# Patient Record
Sex: Female | Born: 1990 | Race: White | Hispanic: No | Smoking: Current every day smoker
Health system: Southern US, Community
[De-identification: ages and names within clinical notes are randomized; demographics above are authoritative.]

## PROBLEM LIST (undated history)

## (undated) DIAGNOSIS — S2239XA Fracture of one rib, unspecified side, initial encounter for closed fracture: Secondary | ICD-10-CM

---
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
## 2015-10-13 ENCOUNTER — Encounter: Payer: Self-pay | Admitting: Emergency Medicine

## 2015-10-13 ENCOUNTER — Emergency Department: Payer: Self-pay

## 2015-10-13 ENCOUNTER — Emergency Department
Admission: EM | Admit: 2015-10-13 | Discharge: 2015-10-13 | Disposition: A | Discharge status: Home / Self Care | Payer: Self-pay | Attending: Emergency Medicine | Admitting: Emergency Medicine

## 2015-10-13 DIAGNOSIS — Y9389 Activity, other specified: Secondary | ICD-10-CM | POA: Insufficient documentation

## 2015-10-13 DIAGNOSIS — F17200 Nicotine dependence, unspecified, uncomplicated: Secondary | ICD-10-CM | POA: Insufficient documentation

## 2015-10-13 DIAGNOSIS — S93401A Sprain of unspecified ligament of right ankle, initial encounter: Secondary | ICD-10-CM | POA: Insufficient documentation

## 2015-10-13 DIAGNOSIS — Y999 Unspecified external cause status: Secondary | ICD-10-CM | POA: Insufficient documentation

## 2015-10-13 DIAGNOSIS — Y929 Unspecified place or not applicable: Secondary | ICD-10-CM | POA: Insufficient documentation

## 2015-10-13 DIAGNOSIS — W010XXA Fall on same level from slipping, tripping and stumbling without subsequent striking against object, initial encounter: Secondary | ICD-10-CM | POA: Insufficient documentation

## 2015-10-13 NOTE — ED Triage Notes (Signed)
Pt presents to Ed c/o ankle injury that started yesterday and slipped and fell while playing with her dog. Pt states it has been swollen ever since. Pt applied ace bandage and elevated it, "I can't walk on it at all".

## 2015-10-13 NOTE — ED Provider Notes (Signed)
[facility] Emergency Department Provider Note  ____________________________________________  Time seen: Approximately 2:01 PM  I have reviewed the triage vital signs and the nursing notes.   HISTORY  Chief Complaint Ankle Injury    HPI Bonnie Morgan is a 25 y.o. female , NAD, presents to the emergency department with one-day history of right ankle pain and swelling. Patient states she was playing with her dog yesterday when she slipped and fell. States she turned the right ankle and had immediate pain and swelling. States she wrapped it with an Ace bandage and elevated it but noted today that she cannot bear weight. Denies any numbness, weakness, tingling of the right lower extremity. Denies any open wounds or lacerations. Denies any chest pain, shortness breath or palpitations.   History reviewed. No pertinent past medical history.  There are no active problems to display for this patient.   Past Surgical History:  Procedure Laterality Date  . cesarean  2012    Prior to Admission medications   Not on File    Allergies Review of patient's allergies indicates no known allergies.  History reviewed. No pertinent family history.  Social History Social History  Substance Use Topics  . Smoking status: Current Every Day Smoker  . Smokeless tobacco: Current User  . Alcohol use No     Review of Systems  Constitutional: No fatigue Cardiovascular: No chest pain, palpitations. Respiratory:  No shortness of breath. Musculoskeletal: Positive right ankle pain. Negative for neck or back pain. Skin: Positive swelling and bruising right ankle. Negative for rash, redness, open wounds or lacerations. Neurological: Negative for headaches, focal weakness or numbness. No tingling. 10-point ROS otherwise negative.  ____________________________________________   PHYSICAL EXAM:  VITAL SIGNS: ED Triage Vitals  Enc Vitals Group     BP 10/13/15 1304 97/65      Pulse Rate 10/13/15 1304 92     Resp --      Temp 10/13/15 1304 98.2 F (36.8 C)     Temp Source 10/13/15 1304 Oral     SpO2 10/13/15 1304 96 %     Weight --      Height --      Head Circumference --      Peak Flow --      Pain Score 10/13/15 1313 7     Pain Loc --      Pain Edu? --      Excl. in GC? --      Constitutional: Alert and oriented. Well appearing and in no acute distress. Eyes: Conjunctivae are normal.   Head: Atraumatic. Cardiovascular: Good peripheral circulation with 2+ pulses noted in the right lower extremity. Capillary refill is brisk in all digits of the right foot. Respiratory: Normal respiratory effort without tachypnea or retractions.  Musculoskeletal: Pain with full flexion and inversion of the right ankle. No laxity with anterior or posterior drawer. No laxity with varus or valgus stress. Full range of motion of the right toes without pain. No lower extremity tenderness nor edema.  No joint effusions. Neurologic:  Normal speech and language. No gross focal neurologic deficits are appreciated. Station a light touch grossly intact about the right lower extremity Skin:  Moderate swelling noted to the lateral right ankle with mild blue ecchymosis. No open wounds or lacerations. Skin is warm, dry and intact. No rash noted. Psychiatric: Mood and affect are normal. Speech and behavior are normal. Patient exhibits appropriate insight and judgement.   ____________________________________________   LABS  None ____________________________________________  EKG  None ____________________________________________  RADIOLOGY I have personally viewed and evaluated these images (plain radiographs) as part of my medical decision making, as well as reviewing the written report by the radiologist.  Dg Ankle Complete Right  Result Date: 10/13/2015 CLINICAL DATA:  Fall.  Ankle swelling. EXAM: RIGHT ANKLE - COMPLETE 3+ VIEW COMPARISON:  None. FINDINGS: Mild lateral  soft tissue swelling. No underlying bony abnormality. No fracture, subluxation or dislocation. IMPRESSION: No bony abnormality. Electronically Signed   By: Charlett Nose M.D.   On: 10/13/2015 13:37   ____________________________________________    PROCEDURES  Procedure(s) performed: None   Procedures   Medications - No data to display   ____________________________________________   INITIAL IMPRESSION / ASSESSMENT AND PLAN / ED COURSE  Pertinent labs & imaging results that were available during my care of the patient were reviewed by me and considered in my medical decision making (see chart for details).  Clinical Course    Patient's diagnosis is consistent with right ankle sprain. Patient's ankle was rewrapped in the Ace wrap in which she provided and was given crutches to assist with ambulation. Patient asked to apply ice to the affected area 20 minutes 3-4 times daily keep elevated when not ambulating. Patient will be discharged home with instructions to take over-the-counter ibuprofen or Tylenol as needed for pain. Patient is to follow up with Dr. Ernest Pine in orthopedics in 1 week if symptoms persist past this treatment course. Patient is given ED precautions to return to the ED for any worsening or new symptoms.      ____________________________________________  FINAL CLINICAL IMPRESSION(S) / ED DIAGNOSES  Final diagnoses:  Right ankle sprain, initial encounter      NEW MEDICATIONS STARTED DURING THIS VISIT:  There are no discharge medications for this patient.        Hope Pigeon, PA-C 10/13/15 1704    Minna Antis, MD 10/14/15 [phone]

## 2015-10-13 NOTE — ED Notes (Signed)
Pt informed to return if any life threatening symptoms occur.  

## 2015-10-13 NOTE — ED Notes (Signed)
States she fell while walking the dog last pm  Swelling noted at ankle  positive pulses noted

## 2015-12-25 ENCOUNTER — Emergency Department: Payer: Self-pay

## 2015-12-25 ENCOUNTER — Emergency Department
Admission: EM | Admit: 2015-12-25 | Discharge: 2015-12-25 | Disposition: A | Discharge status: Home / Self Care | Payer: Self-pay | Attending: Student in an Organized Health Care Education/Training Program | Admitting: Student in an Organized Health Care Education/Training Program

## 2015-12-25 ENCOUNTER — Encounter: Payer: Self-pay | Admitting: Medical Oncology

## 2015-12-25 DIAGNOSIS — Y999 Unspecified external cause status: Secondary | ICD-10-CM | POA: Insufficient documentation

## 2015-12-25 DIAGNOSIS — Y92194 Driveway of other specified residential institution as the place of occurrence of the external cause: Secondary | ICD-10-CM | POA: Insufficient documentation

## 2015-12-25 DIAGNOSIS — S0083XA Contusion of other part of head, initial encounter: Secondary | ICD-10-CM | POA: Insufficient documentation

## 2015-12-25 DIAGNOSIS — Y9389 Activity, other specified: Secondary | ICD-10-CM | POA: Insufficient documentation

## 2015-12-25 DIAGNOSIS — S060X9A Concussion with loss of consciousness of unspecified duration, initial encounter: Secondary | ICD-10-CM | POA: Insufficient documentation

## 2015-12-25 DIAGNOSIS — T07XXXA Unspecified multiple injuries, initial encounter: Secondary | ICD-10-CM

## 2015-12-25 DIAGNOSIS — F17200 Nicotine dependence, unspecified, uncomplicated: Secondary | ICD-10-CM | POA: Insufficient documentation

## 2015-12-25 DIAGNOSIS — S0990XA Unspecified injury of head, initial encounter: Secondary | ICD-10-CM

## 2015-12-25 LAB — CBC WITH DIFFERENTIAL/PLATELET
Basophils Absolute: 0 10*3/uL (ref 0–0.1)
Basophils Relative: 0 %
EOS ABS: 0 10*3/uL (ref 0–0.7)
EOS PCT: 0 %
HCT: 44.3 % (ref 35.0–47.0)
HEMOGLOBIN: 15.4 g/dL (ref 12.0–16.0)
LYMPHS ABS: 2.2 10*3/uL (ref 1.0–3.6)
Lymphocytes Relative: 22 %
MCH: 33.7 pg (ref 26.0–34.0)
MCHC: 34.8 g/dL (ref 32.0–36.0)
MCV: 96.9 fL (ref 80.0–100.0)
MONO ABS: 0.5 10*3/uL (ref 0.2–0.9)
MONOS PCT: 5 %
Neutro Abs: 7.4 10*3/uL — ABNORMAL HIGH (ref 1.4–6.5)
Neutrophils Relative %: 73 %
PLATELETS: 192 10*3/uL (ref 150–440)
RBC: 4.57 MIL/uL (ref 3.80–5.20)
RDW: 14.1 % (ref 11.5–14.5)
WBC: 10.3 10*3/uL (ref 3.6–11.0)

## 2015-12-25 LAB — BASIC METABOLIC PANEL
Anion gap: 9 (ref 5–15)
BUN: 7 mg/dL (ref 6–20)
CHLORIDE: 108 mmol/L (ref 101–111)
CO2: 23 mmol/L (ref 22–32)
CREATININE: 0.7 mg/dL (ref 0.44–1.00)
Calcium: 8.5 mg/dL — ABNORMAL LOW (ref 8.9–10.3)
GFR calc Af Amer: >60 mL/min (ref >60)
GFR calc non Af Amer: >60 mL/min (ref >60)
Glucose, Bld: 102 mg/dL — ABNORMAL HIGH (ref 65–99)
Potassium: 3.8 mmol/L (ref 3.5–5.1)
SODIUM: 140 mmol/L (ref 135–145)

## 2015-12-25 LAB — URINALYSIS COMPLETE WITH MICROSCOPIC (ARMC ONLY)
BILIRUBIN URINE: NEGATIVE
Bacteria, UA: NONE SEEN
Glucose, UA: NEGATIVE mg/dL
KETONES UR: NEGATIVE mg/dL
NITRITE: NEGATIVE
PH: 6 (ref 5.0–8.0)
Protein, ur: 30 mg/dL — AB
SPECIFIC GRAVITY, URINE: 1.023 (ref 1.005–1.030)

## 2015-12-25 LAB — POCT PREGNANCY, URINE: Preg Test, Ur: NEGATIVE
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
## 2016-03-02 ENCOUNTER — Emergency Department
Admission: EM | Admit: 2016-03-02 | Discharge: 2016-03-02 | Payer: No Typology Code available for payment source | Attending: Emergency Medicine | Admitting: Emergency Medicine

## 2016-03-02 ENCOUNTER — Emergency Department: Payer: No Typology Code available for payment source

## 2016-03-02 ENCOUNTER — Encounter: Payer: Self-pay | Admitting: *Deleted

## 2016-03-02 DIAGNOSIS — Y9389 Activity, other specified: Secondary | ICD-10-CM | POA: Insufficient documentation

## 2016-03-02 DIAGNOSIS — F17200 Nicotine dependence, unspecified, uncomplicated: Secondary | ICD-10-CM | POA: Insufficient documentation

## 2016-03-02 DIAGNOSIS — R930 Abnormal findings on diagnostic imaging of skull and head, not elsewhere classified: Secondary | ICD-10-CM | POA: Insufficient documentation

## 2016-03-02 DIAGNOSIS — S2242XA Multiple fractures of ribs, left side, initial encounter for closed fracture: Secondary | ICD-10-CM | POA: Diagnosis not present

## 2016-03-02 DIAGNOSIS — Y92410 Unspecified street and highway as the place of occurrence of the external cause: Secondary | ICD-10-CM | POA: Insufficient documentation

## 2016-03-02 DIAGNOSIS — Z23 Encounter for immunization: Secondary | ICD-10-CM | POA: Diagnosis not present

## 2016-03-02 DIAGNOSIS — Y999 Unspecified external cause status: Secondary | ICD-10-CM | POA: Insufficient documentation

## 2016-03-02 DIAGNOSIS — R1012 Left upper quadrant pain: Secondary | ICD-10-CM | POA: Insufficient documentation

## 2016-03-02 DIAGNOSIS — S299XXA Unspecified injury of thorax, initial encounter: Secondary | ICD-10-CM | POA: Diagnosis present

## 2016-03-02 DIAGNOSIS — S272XXA Traumatic hemopneumothorax, initial encounter: Secondary | ICD-10-CM

## 2016-03-02 DIAGNOSIS — S225XXA Flail chest, initial encounter for closed fracture: Secondary | ICD-10-CM

## 2016-03-02 DIAGNOSIS — T797XXA Traumatic subcutaneous emphysema, initial encounter: Secondary | ICD-10-CM

## 2016-03-02 LAB — CBC WITH DIFFERENTIAL/PLATELET
Basophils Absolute: 0 10*3/uL (ref 0–0.1)
Basophils Relative: 0 %
EOS ABS: 0 10*3/uL (ref 0–0.7)
EOS PCT: 0 %
HCT: 39.2 % (ref 35.0–47.0)
HEMOGLOBIN: 13.5 g/dL (ref 12.0–16.0)
LYMPHS ABS: 3.1 10*3/uL (ref 1.0–3.6)
Lymphocytes Relative: 16 %
MCH: 33.9 pg (ref 26.0–34.0)
MCHC: 34.5 g/dL (ref 32.0–36.0)
MCV: 98.2 fL (ref 80.0–100.0)
MONOS PCT: 3 %
Monocytes Absolute: 0.6 10*3/uL (ref 0.2–0.9)
NEUTROS PCT: 81 %
Neutro Abs: 15 10*3/uL — ABNORMAL HIGH (ref 1.4–6.5)
Platelets: 218 10*3/uL (ref 150–440)
RBC: 3.99 MIL/uL (ref 3.80–5.20)
RDW: 13.8 % (ref 11.5–14.5)
WBC: 18.8 10*3/uL — ABNORMAL HIGH (ref 3.6–11.0)

## 2016-03-02 LAB — COMPREHENSIVE METABOLIC PANEL
ALK PHOS: 80 U/L (ref 38–126)
ALT: 47 U/L (ref 14–54)
ANION GAP: 12 (ref 5–15)
AST: 190 U/L — ABNORMAL HIGH (ref 15–41)
Albumin: 4.4 g/dL (ref 3.5–5.0)
BUN: <5 mg/dL — ABNORMAL LOW (ref 6–20)
CALCIUM: 9.4 mg/dL (ref 8.9–10.3)
CO2: 25 mmol/L (ref 22–32)
Chloride: 100 mmol/L — ABNORMAL LOW (ref 101–111)
Creatinine, Ser: 0.71 mg/dL (ref 0.44–1.00)
Glucose, Bld: 116 mg/dL — ABNORMAL HIGH (ref 65–99)
Potassium: 2.9 mmol/L — ABNORMAL LOW (ref 3.5–5.1)
SODIUM: 137 mmol/L (ref 135–145)
TOTAL PROTEIN: 7.8 g/dL (ref 6.5–8.1)
Total Bilirubin: 0.8 mg/dL (ref 0.3–1.2)

## 2016-03-02 MED ORDER — IOPAMIDOL (ISOVUE-300) INJECTION 61%
75.0000 mL | Freq: Once | INTRAVENOUS | Status: AC | PRN
  Administered 2016-03-02: 75 mL via INTRAVENOUS
  Filled 2016-03-02: qty 75

## 2016-03-02 MED ORDER — HYDROMORPHONE HCL 1 MG/ML IJ SOLN
1.0000 mg | Freq: Once | INTRAMUSCULAR | Status: AC
  Administered 2016-03-02: 1 mg via INTRAVENOUS
  Filled 2016-03-02: qty 1

## 2016-03-02 MED ORDER — HYDROMORPHONE HCL 1 MG/ML IJ SOLN
1.0000 mg | Freq: Once | INTRAMUSCULAR | Status: AC
  Administered 2016-03-02: 1 mg via INTRAVENOUS

## 2016-03-02 MED ORDER — LIDOCAINE HCL (PF) 1 % IJ SOLN
INTRAMUSCULAR | Status: AC
  Administered 2016-03-02: 5 mL
  Filled 2016-03-02: qty 5

## 2016-03-02 MED ORDER — LIDOCAINE HCL (PF) 1 % IJ SOLN
INTRAMUSCULAR | Status: AC
  Administered 2016-03-02: 5 mL
  Filled 2016-03-02: qty 15

## 2016-03-02 MED ORDER — ONDANSETRON HCL 4 MG/2ML IJ SOLN
4.0000 mg | Freq: Once | INTRAMUSCULAR | Status: AC
  Administered 2016-03-02: 4 mg via INTRAVENOUS
  Filled 2016-03-02: qty 2

## 2016-03-02 MED ORDER — SODIUM CHLORIDE 0.9 % IV BOLUS (SEPSIS)
1000.0000 mL | Freq: Once | INTRAVENOUS | Status: AC
  Administered 2016-03-02: 1000 mL via INTRAVENOUS

## 2016-03-02 MED ORDER — MORPHINE SULFATE (PF) 4 MG/ML IV SOLN
4.0000 mg | Freq: Once | INTRAVENOUS | Status: AC
  Administered 2016-03-02: 4 mg via INTRAVENOUS
  Filled 2016-03-02: qty 1

## 2016-03-02 MED ORDER — HYDROMORPHONE HCL 1 MG/ML IJ SOLN
INTRAMUSCULAR | Status: AC
  Administered 2016-03-02: 1 mg via INTRAVENOUS
  Filled 2016-03-02: qty 1

## 2016-03-02 MED ORDER — TETANUS-DIPHTH-ACELL PERTUSSIS 5-2.5-18.5 LF-MCG/0.5 IM SUSP
0.5000 mL | Freq: Once | INTRAMUSCULAR | Status: AC
  Administered 2016-03-02: 0.5 mL via INTRAMUSCULAR
  Filled 2016-03-02: qty 0.5

## 2016-03-02 MED ORDER — LIDOCAINE-EPINEPHRINE (PF) 1 %-1:200000 IJ SOLN: 60.0000 mL | Freq: Once | INTRAMUSCULAR | Status: DC

## 2016-03-02 NOTE — ED Notes (Signed)
Patient placed on 4 L O2 via [place]
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
## 2016-03-02 NOTE — ED Notes (Signed)
Attempted to call report to [facility], line busy.

## 2016-03-02 NOTE — ED Notes (Signed)
[facility]
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
## 2016-03-02 NOTE — ED Notes (Signed)
Patient was unable to use bedpan. Patient was adamant about getting up to walk to the bathroom. Patient agreed to use bedside commode. Patient transferred on and off bedside commode without difficulty or increase in dyspnea. Patient's pulse did increase to 140's, but came back down to the 120's immediately upon getting on the stretcher.

## 2016-03-02 NOTE — ED Notes (Signed)
Bedside poct done preg negative

## 2016-03-02 NOTE — ED Notes (Signed)
MD at bedside, Timeout complete.

## 2016-03-10 ENCOUNTER — Encounter: Payer: Self-pay | Admitting: Emergency Medicine

## 2016-03-10 ENCOUNTER — Emergency Department
Admission: EM | Admit: 2016-03-10 | Discharge: 2016-03-11 | Disposition: A | Discharge status: Other Acute Inpatient Hospital | Payer: Self-pay | Attending: Emergency Medicine | Admitting: Emergency Medicine

## 2016-03-10 ENCOUNTER — Emergency Department: Payer: Self-pay

## 2016-03-10 DIAGNOSIS — Z5181 Encounter for therapeutic drug level monitoring: Secondary | ICD-10-CM | POA: Insufficient documentation

## 2016-03-10 DIAGNOSIS — J942 Hemothorax: Secondary | ICD-10-CM | POA: Insufficient documentation

## 2016-03-10 DIAGNOSIS — F17200 Nicotine dependence, unspecified, uncomplicated: Secondary | ICD-10-CM | POA: Insufficient documentation

## 2016-03-10 MED ORDER — CLINDAMYCIN PHOSPHATE 900 MG/50ML IV SOLN
900.0000 mg | Freq: Once | INTRAVENOUS | Status: DC
  Filled 2016-03-10: qty 50

## 2016-03-10 NOTE — ED Notes (Signed)
Patient transported to XRAY 

## 2016-03-10 NOTE — ED Notes (Signed)
ED Provider at bedside. 
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
## 2016-03-11 ENCOUNTER — Emergency Department: Payer: Self-pay

## 2016-03-11 LAB — URINALYSIS, COMPLETE (UACMP) WITH MICROSCOPIC
Bilirubin Urine: NEGATIVE
GLUCOSE, UA: NEGATIVE mg/dL
HGB URINE DIPSTICK: NEGATIVE
Ketones, ur: NEGATIVE mg/dL
Nitrite: NEGATIVE
PH: 6 (ref 5.0–8.0)
Protein, ur: NEGATIVE mg/dL
SPECIFIC GRAVITY, URINE: 1.006 (ref 1.005–1.030)

## 2016-03-11 LAB — URINE DRUG SCREEN, QUALITATIVE (ARMC ONLY)
AMPHETAMINES, UR SCREEN: NOT DETECTED
Barbiturates, Ur Screen: NOT DETECTED
Benzodiazepine, Ur Scrn: NOT DETECTED
CANNABINOID 50 NG, UR ~~LOC~~: POSITIVE — AB
Cocaine Metabolite,Ur ~~LOC~~: NOT DETECTED
MDMA (ECSTASY) UR SCREEN: NOT DETECTED
Methadone Scn, Ur: NOT DETECTED
Opiate, Ur Screen: NOT DETECTED
PHENCYCLIDINE (PCP) UR S: NOT DETECTED
Tricyclic, Ur Screen: POSITIVE — AB

## 2016-03-11 LAB — COMPREHENSIVE METABOLIC PANEL
ALT: 18 U/L (ref 14–54)
AST: 35 U/L (ref 15–41)
Albumin: 3.5 g/dL (ref 3.5–5.0)
Alkaline Phosphatase: 97 U/L (ref 38–126)
Anion gap: 6 (ref 5–15)
BUN: 6 mg/dL (ref 6–20)
CHLORIDE: 100 mmol/L — AB (ref 101–111)
CO2: 28 mmol/L (ref 22–32)
CREATININE: 0.56 mg/dL (ref 0.44–1.00)
Calcium: 8.6 mg/dL — ABNORMAL LOW (ref 8.9–10.3)
GFR calc Af Amer: >60 mL/min (ref >60)
GLUCOSE: 101 mg/dL — AB (ref 65–99)
Potassium: 3.4 mmol/L — ABNORMAL LOW (ref 3.5–5.1)
Sodium: 134 mmol/L — ABNORMAL LOW (ref 135–145)
Total Bilirubin: 0.6 mg/dL (ref 0.3–1.2)
Total Protein: 7.2 g/dL (ref 6.5–8.1)

## 2016-03-11 LAB — CBC
HEMATOCRIT: 31.3 % — AB (ref 35.0–47.0)
Hemoglobin: 11 g/dL — ABNORMAL LOW (ref 12.0–16.0)
MCH: 36 pg — ABNORMAL HIGH (ref 26.0–34.0)
MCHC: 35.2 g/dL (ref 32.0–36.0)
MCV: 102 fL — AB (ref 80.0–100.0)
PLATELETS: 359 10*3/uL (ref 150–440)
RBC: 3.06 MIL/uL — AB (ref 3.80–5.20)
RDW: 14.4 % (ref 11.5–14.5)
WBC: 9.4 10*3/uL (ref 3.6–11.0)

## 2016-03-11 MED ORDER — MORPHINE SULFATE (PF) 2 MG/ML IV SOLN
2.0000 mg | Freq: Once | INTRAVENOUS | Status: AC
  Administered 2016-03-11: 2 mg via INTRAVENOUS

## 2016-03-11 MED ORDER — PIPERACILLIN-TAZOBACTAM 3.375 G IVPB
3.3750 g | Freq: Once | INTRAVENOUS | Status: DC
  Administered 2016-03-11: 3.375 g via INTRAVENOUS
  Filled 2016-03-11: qty 50

## 2016-03-11 MED ORDER — MORPHINE SULFATE (PF) 2 MG/ML IV SOLN
INTRAVENOUS | Status: AC
  Administered 2016-03-11: 2 mg via INTRAVENOUS
  Filled 2016-03-11: qty 1

## 2016-03-11 MED ORDER — ONDANSETRON HCL 4 MG/2ML IJ SOLN
INTRAMUSCULAR | Status: AC
  Administered 2016-03-11: 4 mg via INTRAVENOUS
  Filled 2016-03-11: qty 2

## 2016-03-11 MED ORDER — BACITRACIN ZINC 500 UNIT/GM EX OINT
TOPICAL_OINTMENT | Freq: Once | CUTANEOUS | Status: AC
  Administered 2016-03-11: 1 via TOPICAL

## 2016-03-11 MED ORDER — BACITRACIN ZINC 500 UNIT/GM EX OINT: TOPICAL_OINTMENT | Freq: Every day | CUTANEOUS | Status: DC

## 2016-03-11 MED ORDER — VANCOMYCIN HCL IN DEXTROSE 1-5 GM/200ML-% IV SOLN
1000.0000 mg | Freq: Once | INTRAVENOUS | Status: AC
  Administered 2016-03-11: 1000 mg via INTRAVENOUS
  Filled 2016-03-11: qty 200

## 2016-03-11 MED ORDER — PIPERACILLIN-TAZOBACTAM 3.375 G IVPB 30 MIN
3.3750 g | Freq: Once | INTRAVENOUS | Status: AC
  Administered 2016-03-11: 3.375 g via INTRAVENOUS

## 2016-03-11 MED ORDER — NICOTINE 21 MG/24HR TD PT24
MEDICATED_PATCH | TRANSDERMAL | Status: AC
  Administered 2016-03-11: 21 mg via TRANSDERMAL
  Filled 2016-03-11: qty 1

## 2016-03-11 MED ORDER — ONDANSETRON HCL 4 MG/2ML IJ SOLN
4.0000 mg | Freq: Once | INTRAMUSCULAR | Status: AC
  Administered 2016-03-11: 4 mg via INTRAVENOUS

## 2016-03-11 MED ORDER — NICOTINE 21 MG/24HR TD PT24
21.0000 mg | MEDICATED_PATCH | Freq: Once | TRANSDERMAL | Status: DC
  Administered 2016-03-11: 21 mg via TRANSDERMAL

## 2016-03-11 MED ORDER — BACITRACIN ZINC 500 UNIT/GM EX OINT
TOPICAL_OINTMENT | CUTANEOUS | Status: AC
  Administered 2016-03-11: 1 via TOPICAL
  Filled 2016-03-11: qty 0.9

## 2016-03-11 NOTE — ED Notes (Signed)
Pt dressing change to left upper chest noted prulent drainage with odor. Cleaned with ns and applied bacitracin gauze and tegaderm.

## 2016-03-11 NOTE — ED Notes (Signed)
Patient transported to CT 
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
## 2016-03-11 NOTE — ED Notes (Signed)
Pt is ambulatory even and unlabored.

## 2016-03-15 LAB — CULTURE, BLOOD (ROUTINE X 2): Culture: NO GROWTH

## 2016-03-16 DIAGNOSIS — S2249XA Multiple fractures of ribs, unspecified side, initial encounter for closed fracture: Secondary | ICD-10-CM

## 2016-03-16 HISTORY — PX: CHEST TUBE INSERTION: SHX231

## 2016-03-16 HISTORY — DX: Multiple fractures of ribs, unspecified side, initial encounter for closed fracture: S22.49XA

## 2016-03-16 LAB — CULTURE, BLOOD (ROUTINE X 2): CULTURE: NO GROWTH

## 2016-03-23 ENCOUNTER — Emergency Department
Admission: EM | Admit: 2016-03-23 | Discharge: 2016-03-23 | Disposition: A | Discharge status: Home / Self Care | Payer: No Typology Code available for payment source | Attending: Emergency Medicine | Admitting: Emergency Medicine

## 2016-03-23 ENCOUNTER — Encounter: Payer: Self-pay | Admitting: Emergency Medicine

## 2016-03-23 ENCOUNTER — Emergency Department: Payer: No Typology Code available for payment source

## 2016-03-23 DIAGNOSIS — F17210 Nicotine dependence, cigarettes, uncomplicated: Secondary | ICD-10-CM | POA: Diagnosis not present

## 2016-03-23 DIAGNOSIS — S2242XD Multiple fractures of ribs, left side, subsequent encounter for fracture with routine healing: Secondary | ICD-10-CM | POA: Insufficient documentation

## 2016-03-23 DIAGNOSIS — S299XXD Unspecified injury of thorax, subsequent encounter: Secondary | ICD-10-CM | POA: Diagnosis present

## 2016-03-23 DIAGNOSIS — R079 Chest pain, unspecified: Secondary | ICD-10-CM

## 2016-03-23 HISTORY — DX: Fracture of one rib, unspecified side, initial encounter for closed fracture: S22.39XA

## 2016-03-23 LAB — BASIC METABOLIC PANEL
ANION GAP: 8 (ref 5–15)
BUN: 9 mg/dL (ref 6–20)
CALCIUM: 10.2 mg/dL (ref 8.9–10.3)
CO2: 28 mmol/L (ref 22–32)
Chloride: 103 mmol/L (ref 101–111)
Creatinine, Ser: 0.86 mg/dL (ref 0.44–1.00)
GFR calc Af Amer: >60 mL/min (ref >60)
GLUCOSE: 107 mg/dL — AB (ref 65–99)
POTASSIUM: 4.3 mmol/L (ref 3.5–5.1)
SODIUM: 139 mmol/L (ref 135–145)

## 2016-03-23 LAB — CBC
HEMATOCRIT: 39.4 % (ref 35.0–47.0)
Hemoglobin: 13.3 g/dL (ref 12.0–16.0)
MCH: 33.2 pg (ref 26.0–34.0)
MCHC: 33.9 g/dL (ref 32.0–36.0)
MCV: 98 fL (ref 80.0–100.0)
Platelets: 519 10*3/uL — ABNORMAL HIGH (ref 150–440)
RBC: 4.02 MIL/uL (ref 3.80–5.20)
RDW: 14.6 % — AB (ref 11.5–14.5)
WBC: 18.2 10*3/uL — AB (ref 3.6–11.0)

## 2016-03-23 LAB — TROPONIN I

## 2016-03-23 MED ORDER — OXYCODONE-ACETAMINOPHEN 5-325 MG PO TABS: 1.0000 | ORAL_TABLET | Freq: Four times a day (QID) | ORAL | 0 refills | Status: AC | PRN
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
## 2016-03-23 NOTE — Discharge Instructions (Signed)
As we discussed please take your pain medication as needed, as prescribed. Use her incentive spirometer at home several times per hour while awake. Please follow-up with [facility] in the next 2 days for recheck/reevaluation. I checked her temperature several times per day at home if he spiked a fever please return to the emergency department. Also return to the emergency department for any trouble breathing, any increased chest pain, or any other symptom personally concerning to yourself.
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
## 2016-07-30 ENCOUNTER — Encounter: Payer: Self-pay | Admitting: *Deleted

## 2016-07-30 ENCOUNTER — Emergency Department: Payer: Self-pay

## 2016-07-30 ENCOUNTER — Inpatient Hospital Stay
Admission: EM | Admit: 2016-07-30 | Discharge: 2016-07-31 | DRG: 894 | Discharge status: Against Medical Advice | Payer: Self-pay | Attending: Internal Medicine | Admitting: Internal Medicine

## 2016-07-30 DIAGNOSIS — F10929 Alcohol use, unspecified with intoxication, unspecified: Secondary | ICD-10-CM

## 2016-07-30 DIAGNOSIS — F17210 Nicotine dependence, cigarettes, uncomplicated: Secondary | ICD-10-CM | POA: Diagnosis present

## 2016-07-30 DIAGNOSIS — J9601 Acute respiratory failure with hypoxia: Secondary | ICD-10-CM | POA: Diagnosis present

## 2016-07-30 DIAGNOSIS — F10129 Alcohol abuse with intoxication, unspecified: Principal | ICD-10-CM | POA: Diagnosis present

## 2016-07-30 DIAGNOSIS — F13129 Sedative, hypnotic or anxiolytic abuse with intoxication, unspecified: Secondary | ICD-10-CM | POA: Diagnosis present

## 2016-07-30 DIAGNOSIS — Y908 Blood alcohol level of 240 mg/100 ml or more: Secondary | ICD-10-CM | POA: Diagnosis present

## 2016-07-30 DIAGNOSIS — Z5321 Procedure and treatment not carried out due to patient leaving prior to being seen by health care provider: Secondary | ICD-10-CM | POA: Diagnosis present

## 2016-07-30 DIAGNOSIS — R4189 Other symptoms and signs involving cognitive functions and awareness: Secondary | ICD-10-CM

## 2016-07-30 DIAGNOSIS — F1910 Other psychoactive substance abuse, uncomplicated: Secondary | ICD-10-CM

## 2016-07-30 DIAGNOSIS — J9690 Respiratory failure, unspecified, unspecified whether with hypoxia or hypercapnia: Secondary | ICD-10-CM | POA: Diagnosis present

## 2016-07-30 DIAGNOSIS — T424X1A Poisoning by benzodiazepines, accidental (unintentional), initial encounter: Secondary | ICD-10-CM | POA: Diagnosis present

## 2016-07-30 DIAGNOSIS — T5191XA Toxic effect of unspecified alcohol, accidental (unintentional), initial encounter: Secondary | ICD-10-CM | POA: Diagnosis present

## 2016-07-30 DIAGNOSIS — F17290 Nicotine dependence, other tobacco product, uncomplicated: Secondary | ICD-10-CM | POA: Diagnosis present

## 2016-07-30 LAB — URINALYSIS, ROUTINE W REFLEX MICROSCOPIC
Bilirubin Urine: NEGATIVE
GLUCOSE, UA: NEGATIVE mg/dL
Hgb urine dipstick: NEGATIVE
Ketones, ur: NEGATIVE mg/dL
NITRITE: NEGATIVE
PH: 5 (ref 5.0–8.0)
Protein, ur: 30 mg/dL — AB
RBC / HPF: NONE SEEN RBC/hpf (ref 0–5)
SPECIFIC GRAVITY, URINE: 1.009 (ref 1.005–1.030)

## 2016-07-30 LAB — ACETAMINOPHEN LEVEL: Acetaminophen (Tylenol), Serum: 19 ug/mL (ref 10–30)

## 2016-07-30 LAB — BLOOD GAS, VENOUS
Acid-base deficit: 5.8 mmol/L — ABNORMAL HIGH (ref 0.0–2.0)
BICARBONATE: 21.5 mmol/L (ref 20.0–28.0)
FIO2: 1
O2 Saturation: 88.8 %
PH VEN: 7.3 (ref 7.250–7.430)
Patient temperature: 34.4
pCO2, Ven: 48 mmHg (ref 44.0–60.0)
pO2, Ven: 55 mmHg — ABNORMAL HIGH (ref 32.0–45.0)

## 2016-07-30 LAB — URINE DRUG SCREEN, QUALITATIVE (ARMC ONLY)
AMPHETAMINES, UR SCREEN: NOT DETECTED
Barbiturates, Ur Screen: NOT DETECTED
Benzodiazepine, Ur Scrn: POSITIVE — AB
Cannabinoid 50 Ng, Ur ~~LOC~~: POSITIVE — AB
Cocaine Metabolite,Ur ~~LOC~~: NOT DETECTED
MDMA (ECSTASY) UR SCREEN: NOT DETECTED
METHADONE SCREEN, URINE: NOT DETECTED
OPIATE, UR SCREEN: NOT DETECTED
PHENCYCLIDINE (PCP) UR S: NOT DETECTED
Tricyclic, Ur Screen: NOT DETECTED

## 2016-07-30 LAB — ETHANOL: ALCOHOL ETHYL (B): 407 mg/dL — AB (ref <5)

## 2016-07-30 LAB — COMPREHENSIVE METABOLIC PANEL
ALT: 37 U/L (ref 14–54)
AST: 67 U/L — ABNORMAL HIGH (ref 15–41)
Albumin: 4.3 g/dL (ref 3.5–5.0)
Alkaline Phosphatase: 100 U/L (ref 38–126)
Anion gap: 15 (ref 5–15)
BILIRUBIN TOTAL: 0.7 mg/dL (ref 0.3–1.2)
BUN: 9 mg/dL (ref 6–20)
CHLORIDE: 104 mmol/L (ref 101–111)
CO2: 21 mmol/L — AB (ref 22–32)
Calcium: 8.6 mg/dL — ABNORMAL LOW (ref 8.9–10.3)
Creatinine, Ser: 0.83 mg/dL (ref 0.44–1.00)
Glucose, Bld: 132 mg/dL — ABNORMAL HIGH (ref 65–99)
POTASSIUM: 4.4 mmol/L (ref 3.5–5.1)
Sodium: 140 mmol/L (ref 135–145)
Total Protein: 7.7 g/dL (ref 6.5–8.1)

## 2016-07-30 LAB — CBC
HEMATOCRIT: 41.3 % (ref 35.0–47.0)
Hemoglobin: 14.2 g/dL (ref 12.0–16.0)
MCH: 34.7 pg — AB (ref 26.0–34.0)
MCHC: 34.5 g/dL (ref 32.0–36.0)
MCV: 100.7 fL — AB (ref 80.0–100.0)
Platelets: 299 10*3/uL (ref 150–440)
RBC: 4.1 MIL/uL (ref 3.80–5.20)
RDW: 15.3 % — AB (ref 11.5–14.5)
WBC: 9.8 10*3/uL (ref 3.6–11.0)

## 2016-07-30 LAB — TROPONIN I: Troponin I: <0.03 ng/mL (ref <0.03)

## 2016-07-30 LAB — SALICYLATE LEVEL

## 2016-07-30 LAB — POCT PREGNANCY, URINE: PREG TEST UR: NEGATIVE

## 2016-07-30 MED ORDER — ENOXAPARIN SODIUM 40 MG/0.4ML ~~LOC~~ SOLN: 40.0000 mg | SUBCUTANEOUS | Status: DC

## 2016-07-30 MED ORDER — SODIUM CHLORIDE 0.9 % IV SOLN
INTRAVENOUS | Status: DC
  Administered 2016-07-30: via INTRAVENOUS

## 2016-07-30 MED ORDER — ONDANSETRON HCL 4 MG/2ML IJ SOLN
4.0000 mg | Freq: Four times a day (QID) | INTRAMUSCULAR | Status: DC | PRN
  Administered 2016-07-31: 4 mg via INTRAVENOUS
  Filled 2016-07-30: qty 2

## 2016-07-30 MED ORDER — PROPOFOL 1000 MG/100ML IV EMUL
5.0000 ug/kg/min | Freq: Once | INTRAVENOUS | Status: AC
  Administered 2016-07-30: 19.608 ug/kg/min via INTRAVENOUS

## 2016-07-30 MED ORDER — SUCCINYLCHOLINE CHLORIDE 20 MG/ML IJ SOLN
100.0000 mg | Freq: Once | INTRAMUSCULAR | Status: AC
  Administered 2016-07-30: 100 mg via INTRAVENOUS

## 2016-07-30 MED ORDER — FENTANYL 2500MCG IN NS 250ML (10MCG/ML) PREMIX INFUSION
INTRAVENOUS | Status: AC
  Administered 2016-07-30: 10 ug/h via INTRAVENOUS
  Filled 2016-07-30: qty 250

## 2016-07-30 MED ORDER — SODIUM CHLORIDE 0.9 % IV BOLUS (SEPSIS)
1000.0000 mL | Freq: Once | INTRAVENOUS | Status: AC
  Administered 2016-07-30: 1000 mL via INTRAVENOUS

## 2016-07-30 MED ORDER — ETOMIDATE 2 MG/ML IV SOLN
0.3000 mg/kg | Freq: Once | INTRAVENOUS | Status: AC
  Administered 2016-07-30: 20.4 mg via INTRAVENOUS

## 2016-07-30 MED ORDER — ONDANSETRON HCL 4 MG PO TABS: 4.0000 mg | ORAL_TABLET | Freq: Four times a day (QID) | ORAL | Status: DC | PRN

## 2016-07-30 MED ORDER — FENTANYL 2500MCG IN NS 250ML (10MCG/ML) PREMIX INFUSION
10.0000 ug/h | INTRAVENOUS | Status: DC
Start: 2016-07-30 — End: 2016-07-31
  Administered 2016-07-30: 10 ug/h via INTRAVENOUS

## 2016-07-30 MED ORDER — FAMOTIDINE IN NACL 20-0.9 MG/50ML-% IV SOLN
20.0000 mg | Freq: Two times a day (BID) | INTRAVENOUS | Status: DC
  Administered 2016-07-31: 20 mg via INTRAVENOUS
  Filled 2016-07-30: qty 50

## 2016-07-30 MED ORDER — SODIUM CHLORIDE 0.9 % IV SOLN
0.4000 ug/kg/h | INTRAVENOUS | Status: DC
  Filled 2016-07-30 (×2): qty 2

## 2016-07-30 NOTE — Consult Note (Signed)
[facility] PULMONARY / CRITICAL CARE MEDICINE   Name: [place] BlasHolly Christianson MRN: [id number] DOB: 07/17/1990    ADMISSION DATE:  07/30/2016 CONSULTATION DATE:  07/30/16  REFERRING MD:  Dr. Anne HahnWillis  CHIEF COMPLAINT: Drug overdose  HISTORY OF PRESENT ILLNESS:   [place] BlasHolly Bourget is a 26 year old female with no known medical history on file. Patient was [facility] via [facility] unresponsive.  EMS reported that patient was drinking alcohol and took 2 percocet tablets. Patient became unresponsive and were not able to arouse her. Patient had agonal respirations upon arrival to the ED . She received 4mg  of Narcan upon arrival but did not respond. Upon arrival to ED her Blood ETOH level was noted to be 407 . Patient was intubated by the EDP.  PCCM team assumed care.  PAST MEDICAL HISTORY :  She  has a past medical history of Broken ribs (2018).  PAST SURGICAL HISTORY: She  has a past surgical history that includes cesarean (2012); Cesarean section; and Chest tube insertion (Left, 2018).  No Known Allergies  No current facility-administered medications on file prior to encounter.    Current Outpatient Prescriptions on File Prior to Encounter  Medication Sig  . oxyCODONE-acetaminophen (ROXICET) 5-325 MG tablet Take 1 tablet by mouth every 6 (six) hours as needed.    FAMILY HISTORY:  Her indicated that her mother is alive. She indicated that her father is deceased.    SOCIAL HISTORY: She  reports that she has been smoking Cigarettes.  She has been smoking about 0.75 packs per day. She uses smokeless tobacco. She reports that she drinks alcohol. She reports that she uses drugs, including Marijuana and Benzodiazepines.  REVIEW OF SYSTEMS:   Unable to obtain as the patient is intubated  SUBJECTIVE:  Unable to obtain as the patient is intubated.  VITAL SIGNS: BP 111/69   Pulse (!) 114   Temp 98.6 F (37 C)   Resp (!) 22   Ht 5\' 3"  (1.6 m)   Wt 150 lb (68 kg)   SpO2 99%   BMI 26.57 kg/m    HEMODYNAMICS:    VENTILATOR SETTINGS:    INTAKE / OUTPUT: No intake/output data recorded.  PHYSICAL EXAMINATION: General:  Young female, intubated and on mechanical ventilation Neuro: Opens eyes to voice,follows simple command HEENT:  AT,[place],No JVD Cardiovascular:  Tachycardic, s1s2,regular, no JVD Lungs:  Clear bilaterally, no wheezes, crackles, rhonchi noted Abdomen: Soft,NT,ND Musculoskeletal:  No edema, cyanosis Skin:  Warm, dry and Intact  LABS:  BMET  Recent Labs Lab 07/30/16 2011  NA 140  K 4.4  CL 104  CO2 21*  BUN 9  CREATININE 0.83  GLUCOSE 132*    Electrolytes  Recent Labs Lab 07/30/16 2011  CALCIUM 8.6*    CBC  Recent Labs Lab 07/30/16 2011  WBC 9.8  HGB 14.2  HCT 41.3  PLT 299    Coag's No results for input(s): APTT, INR in the last 168 hours.  Sepsis Markers No results for input(s): LATICACIDVEN, PROCALCITON, O2SATVEN in the last 168 hours.  ABG No results for input(s): PHART, PCO2ART, PO2ART in the last 168 hours.  Liver Enzymes  Recent Labs Lab 07/30/16 2011  AST 67*  ALT 37  ALKPHOS 100  BILITOT 0.7  ALBUMIN 4.3    Cardiac Enzymes  Recent Labs Lab 07/30/16 2011  TROPONINI <0.03    Glucose No results for input(s): GLUCAP in the last 168 hours.  Imaging Ct Head Wo Contrast  Result Date: 07/30/2016 CLINICAL DATA:  Drug overdose EXAM: CT HEAD WITHOUT CONTRAST TECHNIQUE: Contiguous axial images were obtained from the base of the skull through the vertex without intravenous contrast. COMPARISON:  03/02/2016 FINDINGS: Brain: No evidence of acute infarction, hemorrhage, hydrocephalus, extra-axial collection or mass lesion/mass effect. Vascular: No hyperdense vessel or unexpected calcification. Skull: Normal. Negative for fracture or focal lesion. Sinuses/Orbits: Mild mucosal thickening in the ethmoid, sphenoid and maxillary sinuses. No acute orbital abnormality. Other: None IMPRESSION: No CT evidence for acute  intracranial abnormality. Electronically Signed   By: Jasmine Pang M.D.   On: 07/30/2016 20:58   Dg Chest Portable 1 View  Result Date: 07/30/2016 CLINICAL DATA:  Drug overdose.  Evaluate ETT. EXAM: PORTABLE CHEST 1 VIEW COMPARISON:  March 23, 2016 FINDINGS: The ETT terminates just within the right mainstem bronchus. Recommend withdrawing 3 cm. The right lung is larger than the left, likely due to the mainstem bronchus intubation. Probable atelectasis on the left. The cardiomediastinal silhouette is normal. No pneumothorax or other acute abnormality. IMPRESSION: The ETT terminates in the right mainstem bronchus with mild hyperinflation on the right and mild atelectasis on the left. Recommend withdrawing 3 cm. Findings called to Dr. Minna Antis. Electronically Signed   By: Gerome Sam III M.D   On: 07/30/2016 20:34     STUDIES:  07/30/16 CT head>>No CT evidence for acute intracranial abnormality 5/17 UDS positive for cannabinoid and benzodiazipine  CULTURES: none  ANTIBIOTICS: none  SIGNIFICANT EVENTS: 5/18 Patient admitted to the ICU with  Drug overdose and ETOH abuse, requiring intubation and mechanical ventilation.  LINES/TUBES: 5/18 ET tube>>   ASSESSMENT / PLAN:  PULMONARY A: Acute Respiratory failure due to drug overdose ETOH  P:   Vent settings established VAP bundle implemented SBT trial in am  CARDIOVASCULAR A:  No active issues P:  Continuous Telemetry Keep MAP goal>65  RENAL A:   No active issues P:   Replace electrolytes per usual guidelines Follow BMET intermittently   GASTROINTESTINAL A:   No active issues P:   protonix for GIP HEMATOLOGIC A:   No active issues P:  lovenox for DVT prophylaxis  INFECTIOUS A:   No active issues P:   Follow CBC intermittently  ENDOCRINE A:   No active issues P:   Monitor blood glucose intermittently with BMP  NEUROLOGIC A:   ETOH abuse Drug overdose P:   RASS goal to 0 to -1 Fentanyl  PRN Precedex gtt  FAMILY  - Updates: No family present at the bedside     Alivya Wegman,AG-ACNP Pulmonary and Critical Care Medicine [facility]   07/30/2016, 11:40 PM

## 2016-07-30 NOTE — ED Notes (Signed)
Pt on vent. meds infusing for sedation.  Sinus on monitor. Iv fluids infsuing.  Foley cath and bair hugger in place.

## 2016-07-30 NOTE — H&P (Signed)
[facility] [place] [facility]   PATIENT NAME: Bonnie Morgan    MR#:  [id number]  DATE OF BIRTH:  09/08/1990  DATE OF ADMISSION:  07/30/2016  PRIMARY CARE PHYSICIAN: Patient, No Pcp Per   REQUESTING/REFERRING PHYSICIAN: Paduchowski, MD  CHIEF COMPLAINT:   Chief Complaint  Patient presents with  . Drug Overdose    HISTORY OF PRESENT ILLNESS:  Bonnie Morgan  is a 26 y.o. female who presents with Respiratory failure due to intoxication with alcohol and likely benzodiazepines. Family and/or friends who brought the patient to the ED stated that she drank a significant amount of alcohol, likely alcohol with caffeine. They also report her having taken Percocet. She became unresponsive for them, they brought her to the ED. Here her urine toxicology was positive for cannabinoids and benzodiazepines. Narcan was not very effective in helping her wake up. She was intubated in the ED and hospitalists were called for admission.  PAST MEDICAL HISTORY:   Past Medical History:  Diagnosis Date  . Broken ribs 2018    PAST SURGICAL HISTORY:   Past Surgical History:  Procedure Laterality Date  . cesarean  2012  . CESAREAN SECTION    . CHEST TUBE INSERTION Left 2018    SOCIAL HISTORY:   Social History  Substance Use Topics  . Smoking status: Current Every Day Smoker    Packs/day: 0.75    Types: Cigarettes  . Smokeless tobacco: Current User  . Alcohol use Yes    FAMILY HISTORY:   Family History  Problem Relation Age of Onset  . Family history unknown: Yes    DRUG ALLERGIES:  No Known Allergies  MEDICATIONS AT HOME:   Prior to Admission medications   Medication Sig Start Date End Date Taking? Authorizing Provider  oxyCODONE-acetaminophen (ROXICET) 5-325 MG tablet Take 1 tablet by mouth every 6 (six) hours as needed. 03/23/16   Minna AntisPaduchowski, Kevin, MD    REVIEW OF SYSTEMS:  Review of Systems  Unable to perform ROS: Acuity of condition     VITAL  SIGNS:   Vitals:   07/30/16 2052 07/30/16 2058 07/30/16 2100 07/30/16 2130  BP: (!) 118/92  115/88 108/79  Pulse: (!) 117  (!) 107 95  Resp: (!) 22  (!) 21 20  Temp: (!) 96.6 F (35.9 C) (!) 96.7 F (35.9 C) (!) 96.7 F (35.9 C) 97.2 F (36.2 C)  TempSrc:  Rectal    SpO2: 100%  100% 100%  Weight:      Height:       Wt Readings from Last 3 Encounters:  07/30/16 68 kg (150 lb)  03/23/16 61.2 kg (135 lb)  03/10/16 59 kg (130 lb)    PHYSICAL EXAMINATION:  Physical Exam  Vitals reviewed. Constitutional: She appears well-developed and well-nourished. No distress.  HENT:  Head: Normocephalic and atraumatic.  Mouth/Throat: Oropharynx is clear and moist.  Eyes: Conjunctivae and EOM are normal. Pupils are equal, round, and reactive to light. No scleral icterus.  Neck: Normal range of motion. Neck supple. No JVD present. No thyromegaly present.  Cardiovascular: Normal rate, regular rhythm and intact distal pulses.  Exam reveals no gallop and no friction rub.   No murmur heard. Respiratory: Breath sounds normal. No respiratory distress. She has no wheezes. She has no rales.  Intubated  GI: Soft. Bowel sounds are normal. She exhibits no distension. There is no tenderness.  Musculoskeletal: Normal range of motion. She exhibits no edema.  No arthritis, no gout  Lymphadenopathy:  She has no cervical adenopathy.  Neurological:  Unable to assess due to patient condition  Skin: Skin is warm and dry. No rash noted. No erythema.  Psychiatric:  Unable to assess due to patient condition    LABORATORY PANEL:   CBC  Recent Labs Lab 07/30/16 2011  WBC 9.8  HGB 14.2  HCT 41.3  PLT 299   ------------------------------------------------------------------------------------------------------------------  Chemistries   Recent Labs Lab 07/30/16 2011  NA 140  K 4.4  CL 104  CO2 21*  GLUCOSE 132*  BUN 9  CREATININE 0.83  CALCIUM 8.6*  AST 67*  ALT 37  ALKPHOS 100  BILITOT  0.7   ------------------------------------------------------------------------------------------------------------------  Cardiac Enzymes  Recent Labs Lab 07/30/16 2011  TROPONINI <0.03   ------------------------------------------------------------------------------------------------------------------  RADIOLOGY:  Ct Head Wo Contrast  Result Date: 07/30/2016 CLINICAL DATA:  Drug overdose EXAM: CT HEAD WITHOUT CONTRAST TECHNIQUE: Contiguous axial images were obtained from the base of the skull through the vertex without intravenous contrast. COMPARISON:  03/02/2016 FINDINGS: Brain: No evidence of acute infarction, hemorrhage, hydrocephalus, extra-axial collection or mass lesion/mass effect. Vascular: No hyperdense vessel or unexpected calcification. Skull: Normal. Negative for fracture or focal lesion. Sinuses/Orbits: Mild mucosal thickening in the ethmoid, sphenoid and maxillary sinuses. No acute orbital abnormality. Other: None IMPRESSION: No CT evidence for acute intracranial abnormality. Electronically Signed   By: Jasmine Pang M.D.   On: 07/30/2016 20:58   Dg Chest Portable 1 View  Result Date: 07/30/2016 CLINICAL DATA:  Drug overdose.  Evaluate ETT. EXAM: PORTABLE CHEST 1 VIEW COMPARISON:  March 23, 2016 FINDINGS: The ETT terminates just within the right mainstem bronchus. Recommend withdrawing 3 cm. The right lung is larger than the left, likely due to the mainstem bronchus intubation. Probable atelectasis on the left. The cardiomediastinal silhouette is normal. No pneumothorax or other acute abnormality. IMPRESSION: The ETT terminates in the right mainstem bronchus with mild hyperinflation on the right and mild atelectasis on the left. Recommend withdrawing 3 cm. Findings called to Dr. Minna Antis. Electronically Signed   By: Gerome Sam III M.D   On: 07/30/2016 20:34    EKG:   Orders placed or performed during the hospital encounter of 07/30/16  . ED EKG  . ED EKG  .  EKG 12-Lead  . EKG 12-Lead    IMPRESSION AND PLAN:  Principal Problem:   Respiratory failure (HCC) - due to alcohol intoxication, likely accommodation with benzodiazepine intoxication. Patient suppressed her respiratory drive. She is intubated, will be admitted to the ICU. Active Problems:   Alcoholic intoxication with complication (HCC) - patient will be sedated while intubated, she will then need withdrawal protocol in place once she is more alert and able to be extubated.   Benzodiazepine intoxication - suspected due to urine toxicology and the patient's symptoms, treatment as above unclear if any of this might be intentional overdose for self-harm, will get a psychiatry consult.  All the records are reviewed and case discussed with ED provider. Management plans discussed with the patient and/or family.  DVT PROPHYLAXIS: SubQ lovenox  GI PROPHYLAXIS: H2 blocker  ADMISSION STATUS: Inpatient  CODE STATUS: Full Code Status History    This patient does not have a recorded code status. Please follow your organizational policy for patients in this situation.      TOTAL CRITICAL CARE TIME TAKING CARE OF THIS PATIENT: 50 minutes.   Kerry-Anne Mezo FIELDING 07/30/2016, 9:59 PM  [facility]  Office  [phone]  CC: Primary care physician; Patient,  No Pcp Per  Note:  This document was prepared using Dragon voice recognition software and may include unintentional dictation errors.
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
## 2016-07-30 NOTE — ED Provider Notes (Signed)
[facility] Emergency Department Provider Note  Time seen: 8:09 PM  I have reviewed the triage vital signs and the nursing notes.   HISTORY  Chief Complaint Drug Overdose    HPI Bonnie Morgan is a 26 y.o. female with no known past medical history who presents to the emergency department unresponsive. According to EMS report significant other reported the patient was drinking alcohol tonight and took 2 Percocet tablets. States she became unresponsive today tried to wake her up by placing her in a cold shower which did not work so they called EMS. EMS states upon arrival the patient has agonal/sonorous breathing patient given 4 mg of Narcan without improvement. Upon arrival to the emergency department the patient remains unresponsive to painful and verbal stimuli. EMS assisting ventilations.  Past Medical History:  Diagnosis Date  . Broken ribs 2018    There are no active problems to display for this patient.   Past Surgical History:  Procedure Laterality Date  . cesarean  2012  . CESAREAN SECTION    . CHEST TUBE INSERTION Left 2018    Prior to Admission medications   Medication Sig Start Date End Date Taking? Authorizing Provider  oxyCODONE-acetaminophen (ROXICET) 5-325 MG tablet Take 1 tablet by mouth every 6 (six) hours as needed. 03/23/16   Minna Antis, MD    No Known Allergies  No family history on file.  Social History Social History  Substance Use Topics  . Smoking status: Current Every Day Smoker    Packs/day: 0.75    Types: Cigarettes  . Smokeless tobacco: Current User  . Alcohol use Yes    Review of Systems Unable to obtain review of systems due to unresponsiveness.  ____________________________________________   PHYSICAL EXAM:  VITAL SIGNS: ED Triage Vitals  Enc Vitals Group     BP 07/30/16 2004 (!) 141/95     Pulse Rate 07/30/16 2004 (!) 117     Resp 07/30/16 2004 12     Temp --      Temp src --      SpO2  07/30/16 2004 100 %     Weight 07/30/16 2005 150 lb (68 kg)     Height 07/30/16 2005 5\' 3"  (1.6 m)     Head Circumference --      Peak Flow --      Pain Score --      Pain Loc --      Pain Edu? --      Excl. in GC? --     Constitutional: Unresponsive to painful stimuli. Eyes: 4-5 millimeter, PERRL. ENT   Head: Normocephalic and atraumatic.   Nose: Nasal trumpet in place.   Mouth/Throat: Dry mucous membranes. Cardiovascular: Regular rhythm, rate around 130 bpm. No murmur. Respiratory: Patient has mild rhonchi auscultated bilaterally. Ventilations assisted with bag valve mask. Gastrointestinal: Soft, atraumatic. No distention. Musculoskeletal: Soft, largely atraumatic extremities. Neurologic:  Unresponsive to painful stimuli. Skin:  Skin is cool to the touch, dry. Psychiatric: Unresponsive  ____________________________________________    EKG  EKG reviewed and interpreted by myself shows sinus tachycardia 130 bpm, narrow QRS, normal axis, normal intervals, no concerning ST changes.  ____________________________________________    RADIOLOGY  CT negative Chest x-ray shows main stem ET tube.   ____________________________________________   INITIAL IMPRESSION / ASSESSMENT AND PLAN / ED COURSE  Pertinent labs & imaging results that were available during my care of the patient were reviewed by me and considered in my medical decision making (see chart for  details).  Patient presents to the emergency department unresponsive. Reported alcohol and Percocet use tonight per significant other per EMS report. On arrival the patient is unresponsive to painful and verbal stimuli. Patient unable to protect airway. The decision was made to intubate. Intubation performed, uneventful. On exam the patient has no signs of trauma she does have dilated pupils. Given the acute altered mental status unresponsive to Narcan we will check labs, CT head, chest x-ray. We will continue to  closely monitor in the emergency department. We will IV hydrate while awaiting results. Suspect likely intoxication/substance use.   INTUBATION Performed by: Minna AntisPADUCHOWSKI, Gemayel Mascio  Required items: required blood products, implants, devices, and special equipment available Patient identity confirmed: provided demographic data and hospital-assigned identification number Time out: Immediately prior to procedure a "time out" was called to verify the correct patient, procedure, equipment, support staff and site/side marked as required.  Indications: air way protection  Intubation method: 4.0 Glidescope Laryngoscopy   Preoxygenation: 100% BVM  Sedatives: 20mg  Etomidate Paralytic: 100mg  Succinylcholine  Tube Size: 7.5 cuffed  Post-procedure assessment: chest rise and ETCO2 monitor Breath sounds: equal and absent over the epigastrium Tube secured with: ETT holder Chest x-ray interpreted by radiologist and me.  Chest x-ray findings: endotracheal tube in appropriate position  Patient tolerated the procedure well with no immediate complications.   Respiratory therapist pulled back ET tube 2.5 cm per my instruction.    Patient's labs have resulted with a benzodiazepine positive urine toxicology and alcohol level 407.  Highly suspect intoxication to be the patient's cause of unresponsiveness. CT head was normal.   CRITICAL CARE Performed by: Minna AntisPADUCHOWSKI, Dasean Brow   Total critical care time: 60 minutes  Critical care time was exclusive of separately billable procedures and treating other patients.  Critical care was necessary to treat or prevent imminent or life-threatening deterioration.  Critical care was time spent personally by me on the following activities: development of treatment plan with patient and/or surrogate as well as nursing, discussions with consultants, evaluation of patient's response to treatment, examination of patient, obtaining history from patient or surrogate,  ordering and performing treatments and interventions, ordering and review of laboratory studies, ordering and review of radiographic studies, pulse oximetry and re-evaluation of patient's condition.   ____________________________________________   FINAL CLINICAL IMPRESSION(S) / ED DIAGNOSES  Unresponsiveness Substance use Intoxication   Minna AntisPaduchowski, Amelya Mabry, MD 07/30/16 2117

## 2016-07-30 NOTE — ED Notes (Signed)
Propofol increased to 4ml.  Pt moving arms.

## 2016-07-30 NOTE — ED Notes (Signed)
ett pulled back to 21 by RT per dr. Lenard Lancepaduchowski order.  Pt then to ct scan with rn, tech and RT. Sinus tach on monitor.  Iv meds infusing for sedation .

## 2016-07-30 NOTE — ED Notes (Addendum)
Pt waking up again  Propofol increased to 6ml.  primedoc in with pt for admission.  Sinus tach on monitor.  bair Journalist, newspaperhugger in place.  foley cath in place.

## 2016-07-30 NOTE — ED Notes (Signed)
Sinus tach on monitor.  meds and fluids infusing,

## 2016-07-30 NOTE — ED Notes (Signed)
Husband called with phone number  [phone] [facility].

## 2016-07-30 NOTE — ED Notes (Signed)
Report called to michelle rn ccu nurse.  Pt on vent.  Iv meds infusing.  Sinus tach on monitor.

## 2016-07-30 NOTE — ED Triage Notes (Addendum)
Pt brought in via ems from home with drug overdose on 2 percocet, 4 loco's and unknown amount of budlite.  Pt bagged on arrival to er.  md at bedside   Rt at bedside.  Ems report 4mg  narcan given without response.

## 2016-07-31 LAB — GLUCOSE, CAPILLARY
GLUCOSE-CAPILLARY: 113 mg/dL — AB (ref 65–99)
Glucose-Capillary: 76 mg/dL (ref 65–99)

## 2016-07-31 LAB — BASIC METABOLIC PANEL
ANION GAP: 10 (ref 5–15)
BUN: 8 mg/dL (ref 6–20)
CALCIUM: 7.6 mg/dL — AB (ref 8.9–10.3)
CO2: 19 mmol/L — AB (ref 22–32)
Chloride: 112 mmol/L — ABNORMAL HIGH (ref 101–111)
Creatinine, Ser: 0.57 mg/dL (ref 0.44–1.00)
GFR calc Af Amer: >60 mL/min (ref >60)
GFR calc non Af Amer: >60 mL/min (ref >60)
GLUCOSE: 92 mg/dL (ref 65–99)
Potassium: 4 mmol/L (ref 3.5–5.1)
Sodium: 141 mmol/L (ref 135–145)

## 2016-07-31 LAB — CBC
HEMATOCRIT: 33.4 % — AB (ref 35.0–47.0)
HEMOGLOBIN: 11.4 g/dL — AB (ref 12.0–16.0)
MCH: 34.4 pg — AB (ref 26.0–34.0)
MCHC: 34.3 g/dL (ref 32.0–36.0)
MCV: 100.5 fL — ABNORMAL HIGH (ref 80.0–100.0)
Platelets: 224 10*3/uL (ref 150–440)
RBC: 3.32 MIL/uL — ABNORMAL LOW (ref 3.80–5.20)
RDW: 15.2 % — ABNORMAL HIGH (ref 11.5–14.5)
WBC: 10.4 10*3/uL (ref 3.6–11.0)

## 2016-07-31 LAB — MRSA PCR SCREENING: MRSA by PCR: NEGATIVE

## 2016-07-31 MED ORDER — PROPOFOL 1000 MG/100ML IV EMUL
INTRAVENOUS | Status: AC
  Filled 2016-07-31: qty 100

## 2016-07-31 MED ORDER — FAMOTIDINE 20 MG PO TABS
20.0000 mg | ORAL_TABLET | Freq: Two times a day (BID) | ORAL | Status: DC
Start: 2016-07-31 — End: 2016-07-31

## 2016-07-31 MED ORDER — FENTANYL CITRATE (PF) 100 MCG/2ML IJ SOLN
25.0000 ug | INTRAMUSCULAR | Status: DC | PRN
  Administered 2016-07-31: 25 ug via INTRAVENOUS
  Filled 2016-07-31: qty 2

## 2016-07-31 NOTE — Progress Notes (Signed)
eLink Physician-Brief Progress Note Patient Name: Bonnie Morgan DOB: 12/01/1990 MRN: [id number]   Date of Service  07/31/2016  HPI/Events of Note  [age] F with no major past med h/o presenting unresponsive via EMS following EOTH/percocet ingestion.  Did not respond to Narcan and was intubated for airway support. ETOH level 407 and UDS positive for benzos and THC.   Now waking up but when vent support is reduced the patient becomes apneic.  Currently on camera check nurses at bedside.  Patient is HD stable with sats of 100%  Sedation is currently off with RR of 15 and etCO2 35.  eICU Interventions  Will continue with vent for now.   Will need to assess closely since episodes of apnea when vent support reduced.   Continue with propofol/fentnayl if continued vent support required.     Intervention Category Evaluation Type: New Patient Evaluation  Bonnie Morgan 07/31/2016, 12:22 AM

## 2016-07-31 NOTE — Progress Notes (Signed)
[employer] [facility] [facility] [place] [facility]   PATIENT NAME: Bonnie Morgan    MR#:  [id number]  DATE OF BIRTH:  12/04/1990  SUBJECTIVE:  Patient extubated Patient reports she only drinks one can of beer infrequently. She reports she took 1 Percocet. She denies suicidal ideation or attempt.  REVIEW OF SYSTEMS:    Review of Systems  Constitutional: Negative for fever, chills weight loss HENT: Negative for ear pain, nosebleeds, congestion, facial swelling, rhinorrhea, neck pain, neck stiffness and ear discharge.   Respiratory: Negative for cough, shortness of breath, wheezing  Cardiovascular: Negative for chest pain, palpitations and leg swelling.  Gastrointestinal: Negative for heartburn, abdominal pain, vomiting, diarrhea or consitpation Genitourinary: Negative for dysuria, urgency, frequency, hematuria Musculoskeletal: Negative for back pain or joint pain Neurological: Negative for dizziness, seizures, syncope, focal weakness,  numbness and headaches.  Hematological: Does not bruise/bleed easily.  Psychiatric/Behavioral: Negative for hallucinations, confusion, dysphoric mood She is anxious and tremulous    Tolerating Diet: yes      DRUG ALLERGIES:  No Known Allergies  VITALS:  Blood pressure (!) 88/61, pulse 83, temperature 98.7 F (37.1 C), temperature source Oral, resp. rate 16, height 5\' 5"  (1.651 m), weight 66 kg (145 lb 8.1 oz), SpO2 90 %.  PHYSICAL EXAMINATION:  Constitutional: Appears well-developed and well-nourished. No distress. HENT: Normocephalic. Marland Kitchen. Oropharynx is clear and moist.  Eyes: Conjunctivae and EOM are normal. PERRLA, no scleral icterus.  Neck: Normal ROM. Neck supple. No JVD. No tracheal deviation. CVS: RRR, S1/S2 +, no murmurs, no gallops, no carotid bruit.  Pulmonary: Effort and breath sounds normal, no stridor, rhonchi, wheezes, rales.  Abdominal: Soft. BS +,  no distension, tenderness, rebound or guarding.  Musculoskeletal: Normal range  of motion. No edema and no tenderness.  Neuro: Alert. CN 2-12 grossly intact. No focal deficits. Skin: Skin is warm and dry. No rash noted. Psychiatric: Anxious tremulous    LABORATORY PANEL:   CBC  Recent Labs Lab 07/31/16 0414  WBC 10.4  HGB 11.4*  HCT 33.4*  PLT 224   ------------------------------------------------------------------------------------------------------------------  Chemistries   Recent Labs Lab 07/30/16 2011 07/31/16 0414  NA 140 141  K 4.4 4.0  CL 104 112*  CO2 21* 19*  GLUCOSE 132* 92  BUN 9 8  CREATININE 0.83 0.57  CALCIUM 8.6* 7.6*  AST 67*  --   ALT 37  --   ALKPHOS 100  --   BILITOT 0.7  --    ------------------------------------------------------------------------------------------------------------------  Cardiac Enzymes  Recent Labs Lab 07/30/16 2011  TROPONINI <0.03   ------------------------------------------------------------------------------------------------------------------  RADIOLOGY:  Ct Head Wo Contrast  Result Date: 07/30/2016 CLINICAL DATA:  Drug overdose EXAM: CT HEAD WITHOUT CONTRAST TECHNIQUE: Contiguous axial images were obtained from the base of the skull through the vertex without intravenous contrast. COMPARISON:  03/02/2016 FINDINGS: Brain: No evidence of acute infarction, hemorrhage, hydrocephalus, extra-axial collection or mass lesion/mass effect. Vascular: No hyperdense vessel or unexpected calcification. Skull: Normal. Negative for fracture or focal lesion. Sinuses/Orbits: Mild mucosal thickening in the ethmoid, sphenoid and maxillary sinuses. No acute orbital abnormality. Other: None IMPRESSION: No CT evidence for acute intracranial abnormality. Electronically Signed   By: Jasmine PangKim  Fujinaga M.D.   On: 07/30/2016 20:58   Dg Chest Portable 1 View  Result Date: 07/30/2016 CLINICAL DATA:  Drug overdose.  Evaluate ETT. EXAM: PORTABLE CHEST 1 VIEW COMPARISON:  March 23, 2016 FINDINGS: The ETT terminates just  within the right mainstem bronchus. Recommend withdrawing 3 cm. The right lung is  larger than the left, likely due to the mainstem bronchus intubation. Probable atelectasis on the left. The cardiomediastinal silhouette is normal. No pneumothorax or other acute abnormality. IMPRESSION: The ETT terminates in the right mainstem bronchus with mild hyperinflation on the right and mild atelectasis on the left. Recommend withdrawing 3 cm. Findings called to Dr. Minna Antis. Electronically Signed   By: Gerome Sam III M.D   On: 07/30/2016 20:34     ASSESSMENT AND PLAN:   26 year old female with no past medical history who presents with acute hypoxic respiratory failure due to drug overdose.  1. Acute respiratory failure, hypoxia due to drug overdose: Patient has been extubated.  2. EtOH intoxication: Patient is on CIWA protocol  Patient has psychiatry evaluation pending      Management plans discussed with the patient and she is in agreement.  CODE STATUS: full  TOTAL TIME TAKING CARE OF THIS PATIENT: 30 minutes.     POSSIBLE D/C later today after psych evaluation, DEPENDING ON CLINICAL CONDITION.   Taneesha Edgin M.D on 07/31/2016 at 10:16 AM  Between 7am to 6pm - Pager - [phone] After 6pm go to [email or web address] - password [email or web address]  [facility]  Office  [phone]  CC: Primary care physician; Patient, No Pcp Per  Note: This dictation was prepared with Dragon dictation along with smaller phrase technology. Any transcriptional errors that result from this process are unintentional.

## 2016-07-31 NOTE — Progress Notes (Signed)
Attempted to place pt in PSV 10/5 per request of NP Bincy. Pt is apneic and has no respiratory effort. Will assess pt weaning ability at a later time. RN at bedside and also aware.

## 2016-07-31 NOTE — Progress Notes (Signed)
Pt extubated earlier in night, intially very tearful,and anxious,wanting to go home, talked to husband, swallowing eval done, and ginger ale given with reassurance. Pt then slept, awoke, confused, reassured and slept again. Accidentally pulled out out EJ, IV in hand infiltrated, new IV started

## 2016-07-31 NOTE — Progress Notes (Signed)
CONCERNING: Antibiotic IV to Oral Route Change Policy  RECOMMENDATION: This patient is receiving famotidine by the intravenous route.  Based on criteria approved by the Pharmacy and Therapeutics Committee, the antibiotic(s) is/are being converted to the equivalent oral dose form(s).   DESCRIPTION: These criteria include:  Patient being treated for a respiratory tract infection, urinary tract infection, cellulitis or clostridium difficile associated diarrhea if on metronidazole  The patient is not neutropenic and does not exhibit a GI malabsorption state  The patient is eating (either orally or via tube) and/or has been taking other orally administered medications for a least 24 hours  The patient is improving clinically and has a Tmax < 100.5  If you have questions about this conversion, please contact the Pharmacy Department  []   [phone]( [phone] )  [facility] []   [phone]( [phone] )  [facility] []   [phone]( [phone] )  [facility] []   [phone]( [phone] )  [facility] []   [phone]( [phone] )  [facility]

## 2016-07-31 NOTE — Progress Notes (Signed)
NP in to see, sedation decreased prior to possible extubation, pt is restless in bed, RN is at bedside, attempted numerous times to console and educate pt with limited effect

## 2016-07-31 NOTE — Progress Notes (Signed)
Patient insists on leaving AMA, educated pt that MD wants her to see Psychiatrist before d/c, pt refuses and states shes old enough to drink and does not need psych. Dr Juliene PinaMody and Belia HemanKasa notified of pt's decision and need to leave AMA, pt's grandmother at bedside and states she will make sure pt sees a doctor. Educated pt and grandmother on calling ems if needed. VS wnl at time of d/c, Dr Belia HemanKasa notified of pt just left. IVs removed and gauze applied, no bleeding noted.

## 2016-07-31 NOTE — Progress Notes (Signed)
Pt admitted from ER on a ventilator, sedated at times, restless at times. VSS  baig hugger removed as temp is WNL. N saline and diprovan running, Fentanyl started at low dose.  Pt continues to be restless, opens eyes and follows commands.

## 2016-07-31 NOTE — Progress Notes (Signed)
Pt was extubated to room air per NP Bincy's verbal order. NP and  RN at bedside. Pt HR 115, SPO2 96%, RR 16 and is speaking in complete sentences at this time. Will continue to monitor pt.

## 2016-08-03 NOTE — Discharge Summary (Signed)
Patient left AMA    26 year old female with no past medical history who presents with acute hypoxic respiratory failure due to drug overdose.  1. Acute respiratory failure, hypoxia due to drug overdose: Patient was extubated.  2. EtOH intoxication: Patient was on CIWA protocol

## 2017-12-22 IMAGING — CT CT HEAD W/O CM
3 series · 15 of 45 positions shown, 18 images · non-contrast
Comparison: 03/02/2016

CLINICAL DATA: Drug overdose

EXAM:
CT HEAD WITHOUT CONTRAST
TECHNIQUE: Contiguous axial images were obtained from the base of the skull
through the vertex without intravenous contrast.

[Series 2: head wo · axial · 0.42mm/px · z∈[+711,+826]mm · 9 of 28 slices shown, 12 images]
[im 3/28  brain]
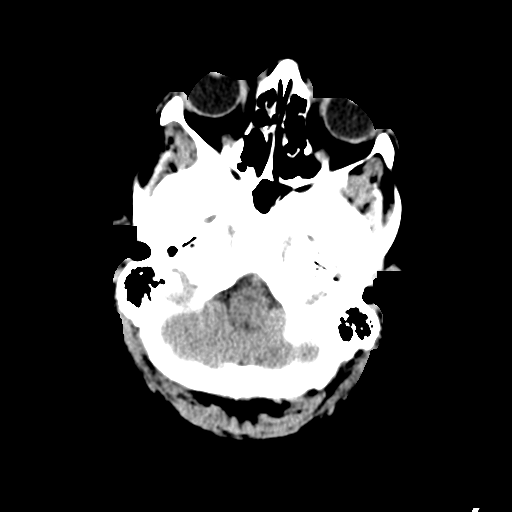
[im 3/28  bone]
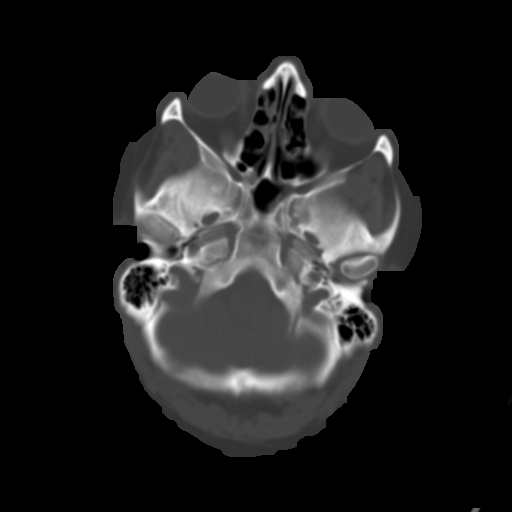
[im 6/28  brain]
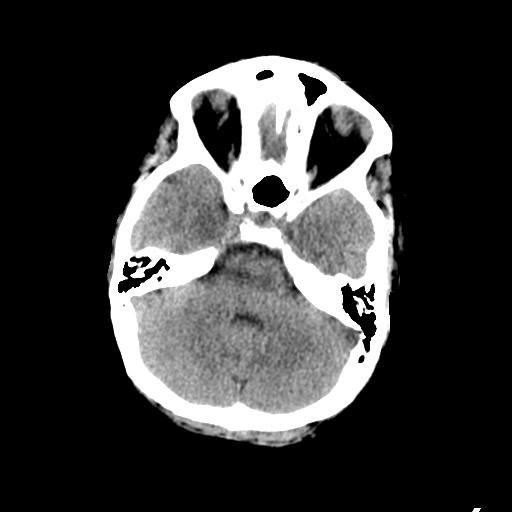
[im 9/28  brain]
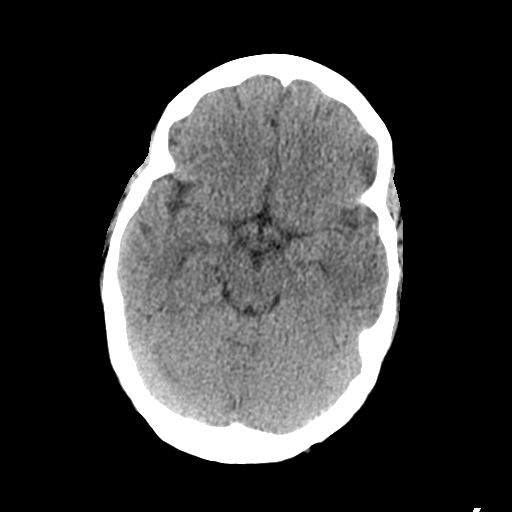
[im 12/28  brain]
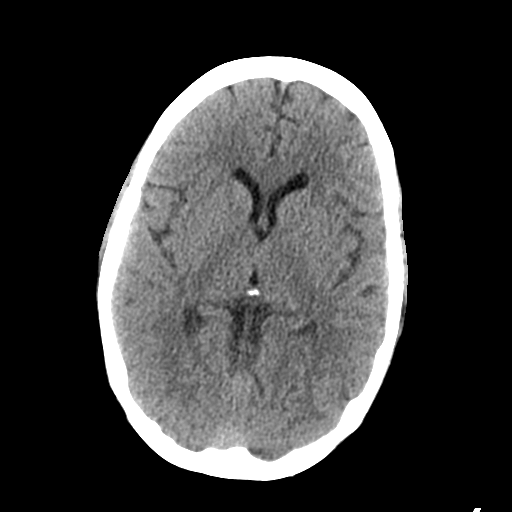
[im 15/28  brain]
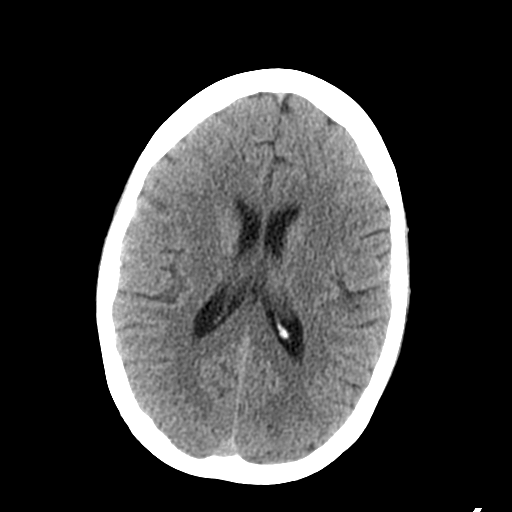
[im 15/28  bone]
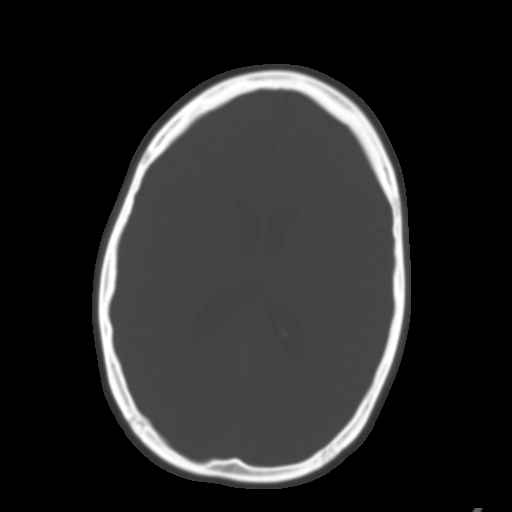
[im 17/28  brain]
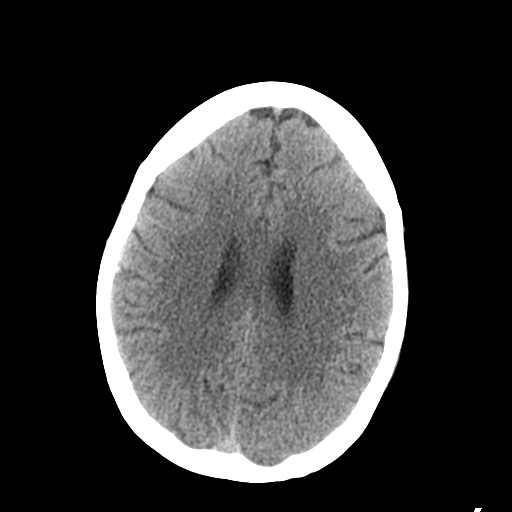
[im 20/28  brain]
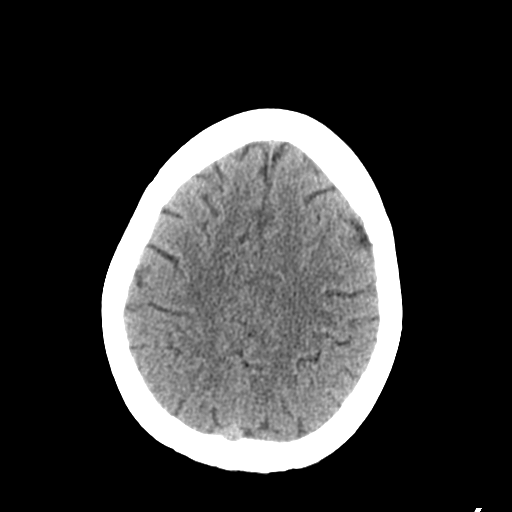
[im 23/28  brain]
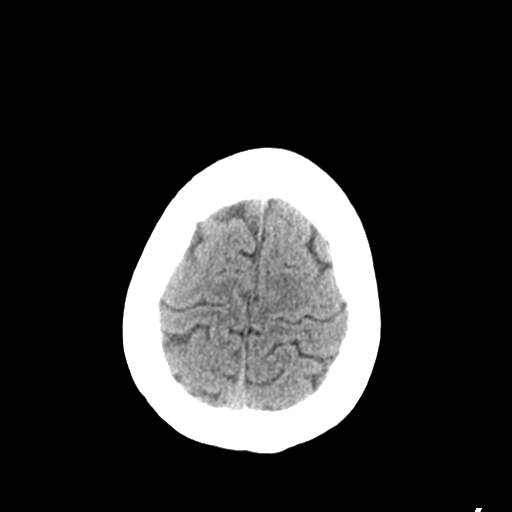
[im 26/28  brain]
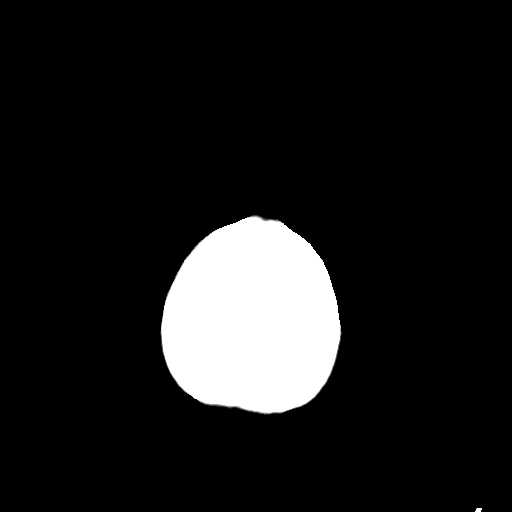
[im 26/28  bone]
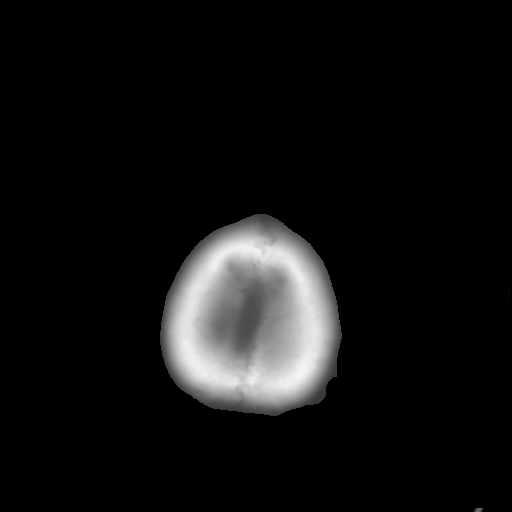

[Series 4: coronal soft tissue · coronal · 0.27mm/px · 3 of 60 slices shown]
[im 20/60  brain]
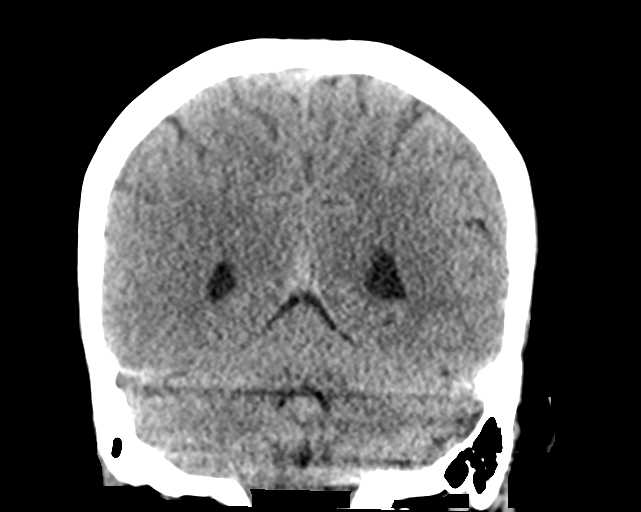
[im 27/60  brain]
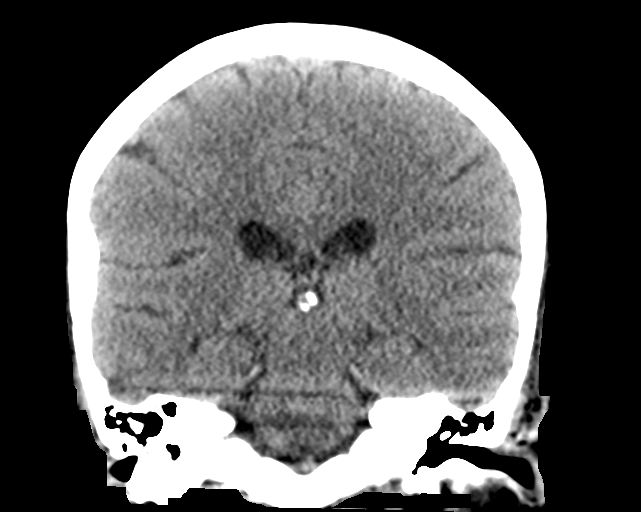
[im 33/60  brain]
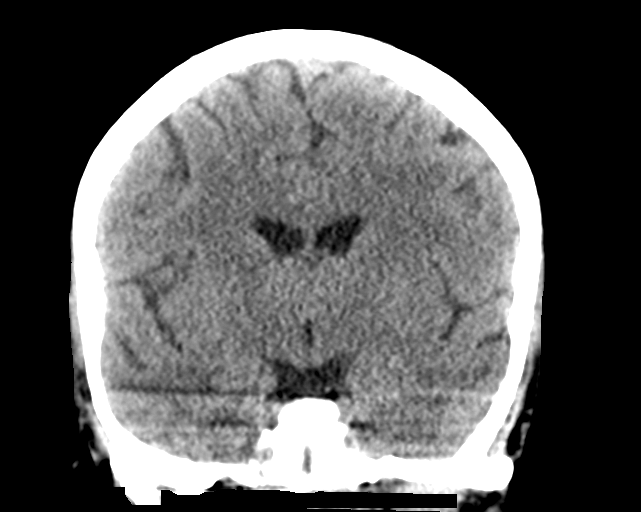

[Series 5: sagittal soft tissue · sagittal · 0.28mm/px · 3 of 46 slices shown]
[im 16/46  brain]
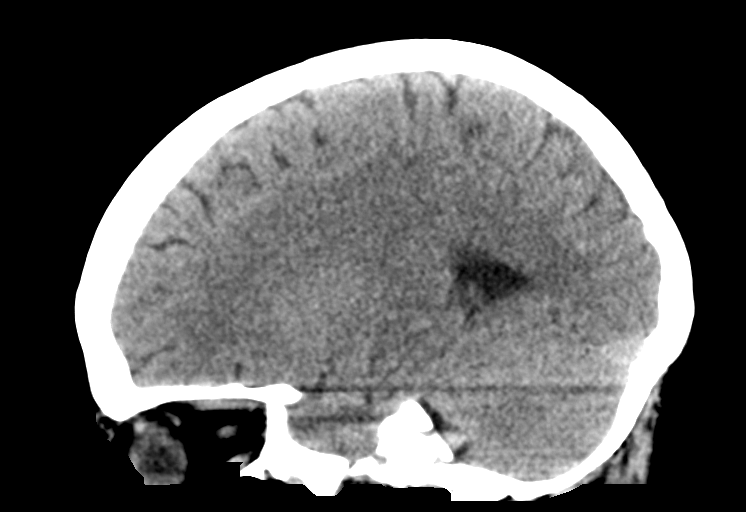
[im 23/46  brain]
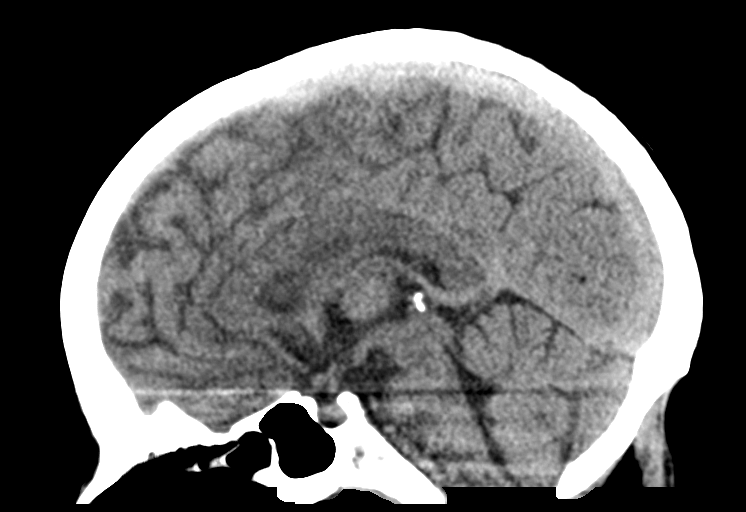
[im 31/46  brain]
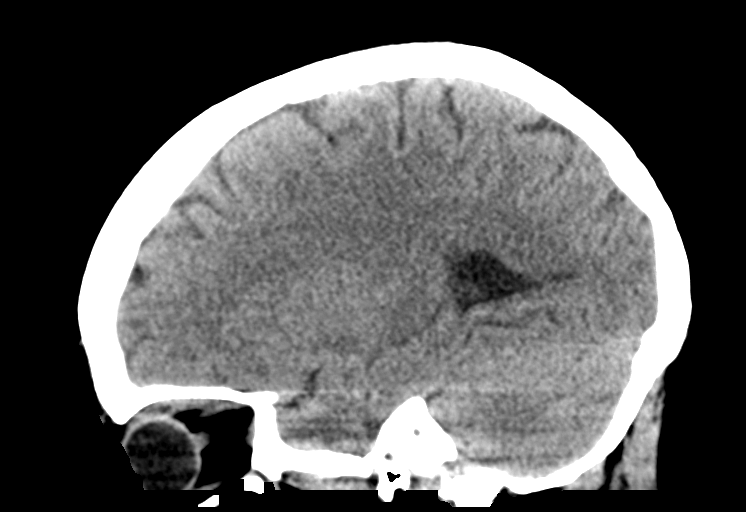

[15 of 45 positions shown; findings below may reference images not displayed]

FINDINGS: Brain: No evidence of acute infarction, hemorrhage, hydrocephalus,
extra-axial collection or mass lesion/mass effect.

Vascular: No hyperdense vessel or unexpected calcification.

Skull: Normal. Negative for fracture or focal lesion.

Sinuses/Orbits: Mild mucosal thickening in the ethmoid, sphenoid and
maxillary sinuses. No acute orbital abnormality.

Other: None
IMPRESSION: No CT evidence for acute intracranial abnormality.

## 2017-12-22 IMAGING — DX DG CHEST 1V PORT
1 series · 1 of 1 positions shown · non-contrast
Comparison: March 23, 2016

CLINICAL DATA: Drug overdose.  Evaluate ETT.

EXAM:
PORTABLE CHEST 1 VIEW

[chest ap]
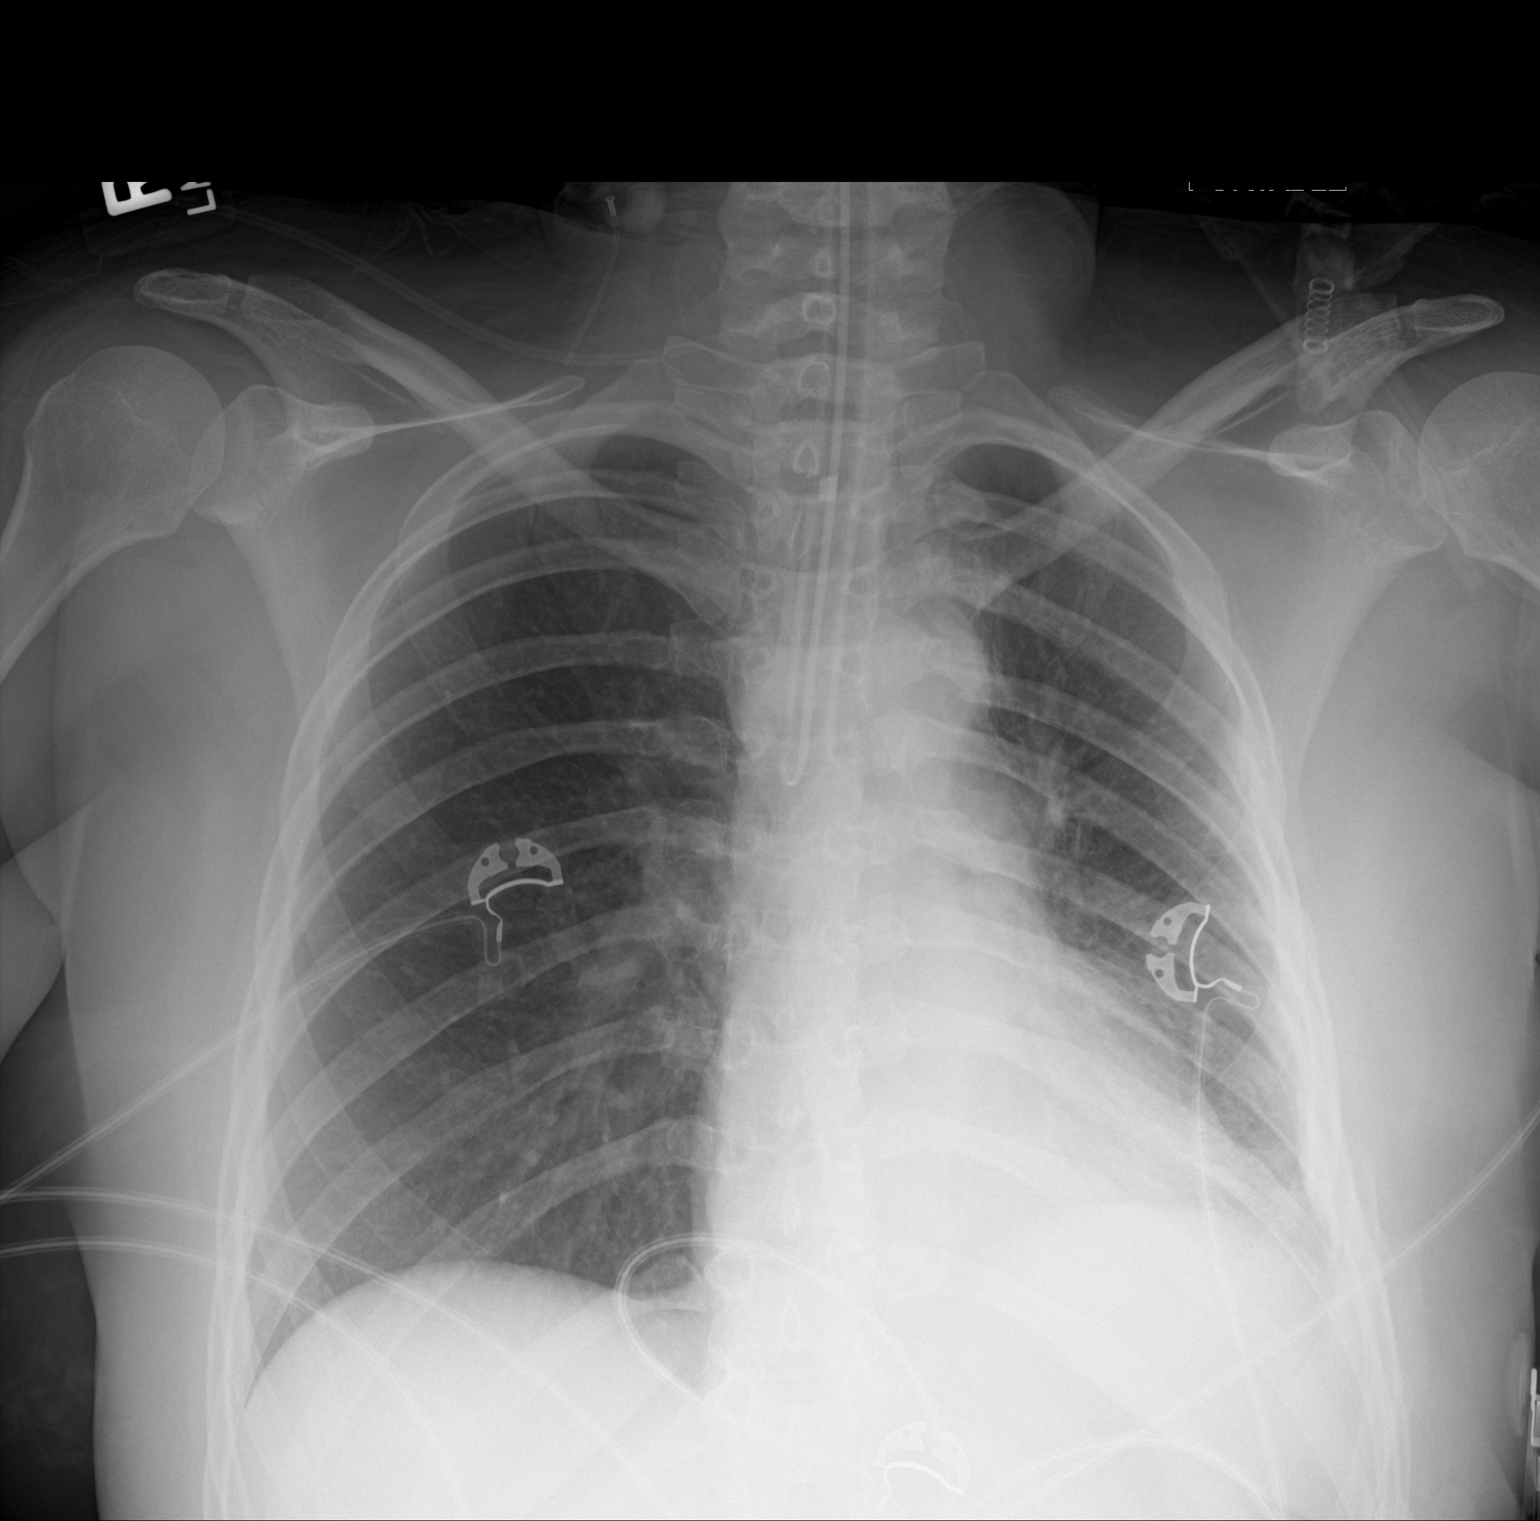

[1 of 1 positions shown; findings below may reference images not displayed]

FINDINGS: The ETT terminates just within the right mainstem bronchus.
Recommend withdrawing 3 cm. The right lung is larger than the left,
likely due to the mainstem bronchus intubation. Probable atelectasis
on the left. The cardiomediastinal silhouette is normal. No
pneumothorax or other acute abnormality.
IMPRESSION: The ETT terminates in the right mainstem bronchus with mild
hyperinflation on the right and mild atelectasis on the left.
Recommend withdrawing 3 cm. Findings called to Dr. Marie Franceline
Licia.

## 2023-08-02 ENCOUNTER — Other Ambulatory Visit: Payer: Self-pay | Admitting: Family Medicine

## 2023-08-02 DIAGNOSIS — M79641 Pain in right hand: Secondary | ICD-10-CM

## 2024-03-18 ENCOUNTER — Encounter: Payer: Self-pay | Admitting: Emergency Medicine

## 2024-03-18 ENCOUNTER — Ambulatory Visit
Admission: EM | Admit: 2024-03-18 | Discharge: 2024-03-18 | Disposition: A | Discharge status: Home / Self Care | Attending: Emergency Medicine | Admitting: Emergency Medicine

## 2024-03-18 DIAGNOSIS — R0789 Other chest pain: Secondary | ICD-10-CM

## 2024-03-18 DIAGNOSIS — J069 Acute upper respiratory infection, unspecified: Secondary | ICD-10-CM

## 2024-03-18 DIAGNOSIS — M545 Low back pain, unspecified: Secondary | ICD-10-CM | POA: Diagnosis not present

## 2024-03-18 MED ORDER — PREDNISONE 10 MG (21) PO TBPK: ORAL_TABLET | Freq: Every day | ORAL | 0 refills | Status: AC

## 2024-03-18 MED ORDER — AMOXICILLIN-POT CLAVULANATE 875-125 MG PO TABS: 1.0000 | ORAL_TABLET | Freq: Two times a day (BID) | ORAL | 0 refills | Status: AC

## 2024-03-18 NOTE — ED Provider Notes (Signed)
 " [facility]    CSN: [id number] Arrival date & time: 03/18/24  1416      History   Chief Complaint No chief complaint on file.   HPI Bonnie Morgan is a 34 y.o. female.   Upper rspitraoty for went to [facility],   Zpack finished today,  Robitussin lavage, nasal lavage  Hiurts to breath  Today,  Nasal spray    Mucles aches,   Hurt to take a depp breath chest hurting on right side, and center   9 days,  Kids sick   Sudafed Productive cough, dark and thickj   Lmp 12/23 , ocp   Past Medical History:  Diagnosis Date   Broken ribs 2018    Patient Active Problem List   Diagnosis Date Noted   Alcoholic intoxication with complication 07/30/2016   Benzodiazepine intoxication (HCC) 07/30/2016   Substance abuse (HCC) 07/30/2016   Respiratory failure (HCC) 07/30/2016    Past Surgical History:  Procedure Laterality Date   cesarean  2012   CESAREAN SECTION     CHEST TUBE INSERTION Left 2018    OB History   No obstetric history on file.      Home Medications    Prior to Admission medications  Medication Sig Start Date End Date Taking? Authorizing Provider  acetaminophen  (TYLENOL ) 500 MG tablet Take 1,000 mg by mouth every 6 (six) hours as needed.    [provider]  gabapentin (NEURONTIN) 300 MG capsule Take 300 mg by mouth as needed.    [provider]  ibuprofen (ADVIL,MOTRIN) 200 MG tablet Take 200 mg by mouth every 6 (six) hours as needed.    [provider]  oxyCODONE -acetaminophen  (ROXICET) 5-325 MG tablet Take 1 tablet by mouth every 6 (six) hours as needed. 03/23/16   Dorothyann Drivers, MD    Family History Family History  Family history unknown: Yes    Social History Social History[1]   Allergies   Patient has no known allergies.   Review of Systems Review of Systems   Physical Exam Triage Vital Signs ED Triage Vitals  Encounter Vitals Group     BP      Girls Systolic BP Percentile      Girls  Diastolic BP Percentile      Boys Systolic BP Percentile      Boys Diastolic BP Percentile      Pulse      Resp      Temp      Temp src      SpO2      Weight      Height      Head Circumference      Peak Flow      Pain Score      Pain Loc      Pain Education      Exclude from Growth Chart    No data found.  Updated Vital Signs There were no vitals taken for this visit.  Visual Acuity Right Eye Distance:   Left Eye Distance:   Bilateral Distance:    Right Eye Near:   Left Eye Near:    Bilateral Near:     Physical Exam Constitutional:      Appearance: Normal appearance.  Eyes:     Extraocular Movements: Extraocular movements intact.  Cardiovascular:     Rate and Rhythm: Normal rate and regular rhythm.     Pulses: Normal pulses.     Heart sounds: Normal heart sounds.  Pulmonary:  Effort: Pulmonary effort is normal.     Breath sounds: Normal breath sounds.  Neurological:     Mental Status: She is alert and oriented to person, place, and time. Mental status is at baseline.      UC Treatments / Results  Labs (all labs ordered are listed, but only abnormal results are displayed) Labs Reviewed - No data to display  EKG   Radiology No results found.  Procedures Procedures (including critical care time)  Medications Ordered in UC Medications - No data to display  Initial Impression / Assessment and Plan / UC Course  I have reviewed the triage vital signs and the nursing notes.  Pertinent labs & imaging results that were available during my care of the patient were reviewed by me and considered in my medical decision making (see chart for details).   Final Clinical Impressions(s) / UC Diagnoses   Final diagnoses:  None   Discharge Instructions   None    ED Prescriptions   None    PDMP not reviewed this encounter.     [1]  Social History Tobacco Use   Smoking status: Every Day    Current packs/day: 0.75    Types: Cigarettes    Smokeless tobacco: Current  Substance Use Topics   Alcohol use: Yes   Drug use: Yes    Types: Marijuana, Benzodiazepines   "

## 2024-03-18 NOTE — Discharge Instructions (Signed)
 Today on exam your lungs are clear and you are getting enough air without assistance, on exam able to press over your chest and your back and cause the pain that you are experiencing which is reassuring is that this is muscular  You will be started on an additional antibiotic to ensure all germs have cleared that are contributing to your symptoms, take Augmentin  twice daily for 7 days  To help with chest and back pain begin prednisone  every morning with food as directed to reduce inflammation, may use Tylenol  or use any topical medicines additionally   For cough: honey 1/2 to 1 teaspoon (you can dilute the honey in water or another fluid).  You can also use guaifenesin and dextromethorphan for cough. You can use a humidifier for chest congestion and cough.  If you don't have a humidifier, you can sit in the bathroom with the hot shower running.      For sore throat: try warm salt water gargles, cepacol lozenges, throat spray, warm tea or water with lemon/honey, popsicles or ice, or OTC cold relief medicine for throat discomfort.   For congestion: take a daily anti-histamine like Zyrtec, Claritin, and a oral decongestant, such as pseudoephedrine.  You can also use Flonase 1-2 sprays in each nostril daily.   It is important to stay hydrated: drink plenty of fluids (water, gatorade/powerade/pedialyte, juices, or teas) to keep your throat moisturized and help further relieve irritation/discomfort.

## 2024-03-18 NOTE — ED Notes (Signed)
 Patient triage by provider Teresa Shelba SAUNDERS, NP
